# Patient Record
Sex: Female | Born: 1940 | Race: White | Hispanic: No | Marital: Married | Smoking: Never smoker
Health system: Southern US, Community
[De-identification: ages and names within clinical notes are randomized; demographics above are authoritative.]

## PROBLEM LIST (undated history)

## (undated) DIAGNOSIS — Z860100 Personal history of colon polyps, unspecified: Secondary | ICD-10-CM

## (undated) DIAGNOSIS — K219 Gastro-esophageal reflux disease without esophagitis: Secondary | ICD-10-CM

## (undated) DIAGNOSIS — E039 Hypothyroidism, unspecified: Secondary | ICD-10-CM

## (undated) DIAGNOSIS — M5030 Other cervical disc degeneration, unspecified cervical region: Secondary | ICD-10-CM

## (undated) DIAGNOSIS — Z8679 Personal history of other diseases of the circulatory system: Secondary | ICD-10-CM

## (undated) DIAGNOSIS — E7800 Pure hypercholesterolemia, unspecified: Secondary | ICD-10-CM

## (undated) HISTORY — DX: Personal history of colon polyps, unspecified: Z86.0100

## (undated) HISTORY — PX: BREAST EXCISIONAL BIOPSY: SUR124

## (undated) HISTORY — DX: Gastro-esophageal reflux disease without esophagitis: K21.9

## (undated) HISTORY — DX: Pure hypercholesterolemia, unspecified: E78.00

## (undated) HISTORY — PX: TRIGGER FINGER RELEASE: SHX641

## (undated) HISTORY — PX: ROTATOR CUFF REPAIR: SHX139

## (undated) HISTORY — PX: BREAST BIOPSY: SHX20

## (undated) HISTORY — DX: Other cervical disc degeneration, unspecified cervical region: M50.30

## (undated) HISTORY — DX: Personal history of other diseases of the circulatory system: Z86.79

## (undated) HISTORY — DX: Personal history of colonic polyps: Z86.010

## (undated) HISTORY — DX: Hypothyroidism, unspecified: E03.9

## (undated) HISTORY — PX: TONSILLECTOMY AND ADENOIDECTOMY: SUR1326

---

## 2000-04-29 HISTORY — PX: MASTECTOMY: SHX3

## 2007-04-30 HISTORY — PX: AUGMENTATION MAMMAPLASTY: SUR837

## 2013-04-29 HISTORY — PX: CATARACT EXTRACTION: SUR2

## 2019-06-09 ENCOUNTER — Telehealth: Payer: Self-pay | Admitting: General Practice

## 2019-06-09 NOTE — Telephone Encounter (Signed)
Telephone 02/23/2019 [facility]   Dale [place], MD Internal Medicine  Appointment Reason for call  Conversation: Appointment (Newest Message First) June 09, 2019 Me   BP  9:06 AM Note   Pt came into office 06/08/19 and wanted to set up new patient appt's with Dr. Lorin Picket. I made an appt for him on 11/02/19 and for his wife Malkia on 11/09/19. He didn't want a sooner appointment because they already had appts with the previous doctor before they moved here.       JB  9:02 AM Blenda Peals contacted Me (In Person)   February 23, 2019 Billie Lade   [facility]  2:35 PM Note   Lm  to call back to est. Care with Dr. Hinda Kehr, Margaretmary Dys   [facility]  2:35 PM Note   ----- Message from Larry Sierras, LPN sent at 37/34/2876  8:59 AM EDT ----- Regarding: FW: new pt Please schedule patient for new pt appt.  ----- Message ----- From: Dale [place], MD Sent: 02/15/2019   8:33 PM EDT To: Larry Sierras, LPN Subject: RE: new pt                                     Ok.   Dr Lorin Picket ----- Message ----- From: Larry Sierras, LPN Sent: 81/15/7262  11:30 AM EDT To: Dale [place], MD Subject: new pt                                         Please see phone note for this patient about him and his wife establishing care with you.

## 2019-08-17 ENCOUNTER — Encounter: Payer: Self-pay | Admitting: Internal Medicine

## 2019-11-05 ENCOUNTER — Ambulatory Visit (INDEPENDENT_AMBULATORY_CARE_PROVIDER_SITE_OTHER): Payer: Medicare Other | Admitting: Internal Medicine

## 2019-11-05 ENCOUNTER — Other Ambulatory Visit: Payer: Self-pay

## 2019-11-05 ENCOUNTER — Encounter: Payer: Self-pay | Admitting: Internal Medicine

## 2019-11-05 VITALS — BP 128/76 | HR 79 | Temp 98.1°F | Resp 16 | Ht 61.5 in | Wt 128.4 lb

## 2019-11-05 DIAGNOSIS — Z1231 Encounter for screening mammogram for malignant neoplasm of breast: Secondary | ICD-10-CM

## 2019-11-05 DIAGNOSIS — M5030 Other cervical disc degeneration, unspecified cervical region: Secondary | ICD-10-CM | POA: Diagnosis not present

## 2019-11-05 DIAGNOSIS — E039 Hypothyroidism, unspecified: Secondary | ICD-10-CM

## 2019-11-05 DIAGNOSIS — E7800 Pure hypercholesterolemia, unspecified: Secondary | ICD-10-CM | POA: Diagnosis not present

## 2019-11-05 DIAGNOSIS — Z853 Personal history of malignant neoplasm of breast: Secondary | ICD-10-CM

## 2019-11-05 DIAGNOSIS — H269 Unspecified cataract: Secondary | ICD-10-CM

## 2019-11-05 DIAGNOSIS — Z8589 Personal history of malignant neoplasm of other organs and systems: Secondary | ICD-10-CM

## 2019-11-05 DIAGNOSIS — Z86010 Personal history of colonic polyps: Secondary | ICD-10-CM

## 2019-11-05 NOTE — Progress Notes (Signed)
Patient ID: Kayla Navarro, female   DOB: 1940/12/13, 79 y.o.   MRN: [id number]   Subjective:    Patient ID: Kayla Navarro, female    DOB: 11-17-1940, 79 y.o.   MRN: [id number]  HPI This visit occurred during the SARS-CoV-2 public health emergency.  Safety protocols were in place, including screening questions prior to the visit, additional usage of staff PPE, and extensive cleaning of exam room while observing appropriate contact time as indicated for disinfecting solutions.  Patient here to establish care.  Previously being seen by a physician in [place].  Has a history of elevated blood pressure.  Off hctz now for a while.  States blood pressures mostly averaging around 045 systolic readings. Stays active.  Tai Chi Art therapist.  Walks regularly.  No chest pain or sob.  No acid reflux.  No abdominal pain or cramping.  Bowels moving.  Borderline cholesterol.  Taking red yeast rice.  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  Has a history of breast cancer.  S/p left breast mastectomy.  Is s/p reconstructive surgery - breast.  Also has neck pain.  Degenerative disc - cervical spine.  Some right arm numbness associated.  Her exercises and stretches - have helped and made this manageable.  States needs cataract surgery.  Needs f/u with ophthalmology here.     Past Medical History:  Diagnosis Date  . Degenerative disc disease, cervical   . GERD (gastroesophageal reflux disease)   . History of colon polyps   . History of hypertension   . Hypercholesteremia   . Hypothyroid    Past Surgical History:  Procedure Laterality Date  . CATARACT EXTRACTION Right 2015  . MASTECTOMY  2002  . ROTATOR CUFF REPAIR    . TONSILLECTOMY AND ADENOIDECTOMY    . TRIGGER FINGER RELEASE     Family History  Problem Relation Age of Onset  . Stomach cancer Mother   . Hypertension Mother   . CAD Mother   . Hypercholesterolemia Mother   . Parkinson's disease Father   . Heart disease Father    . High Cholesterol Father   . Heart disease Brother   . Parkinson's disease Paternal Grandmother    Social History   Socioeconomic History  . Marital status: Married    Spouse name: Not on file  . Number of children: Not on file  . Years of education: Not on file  . Highest education level: Not on file  Occupational History  . Not on file  Tobacco Use  . Smoking status: Never Smoker  . Smokeless tobacco: Never Used  Substance and Sexual Activity  . Alcohol use: Yes    Comment: occasional  . Drug use: Never  . Sexual activity: Not Currently  Other Topics Concern  . Not on file  Social History Narrative  . Not on file   Social Determinants of Health   Financial Resource Strain:   . Difficulty of Paying Living Expenses:   Food Insecurity:   . Worried About Charity fundraiser in the Last Year:   . Arboriculturist in the Last Year:   Transportation Needs:   . Film/video editor (Medical):   Marland Kitchen Lack of Transportation (Non-Medical):   Physical Activity:   . Days of Exercise per Week:   . Minutes of Exercise per Session:   Stress:   . Feeling of Stress :   Social Connections:   . Frequency of Communication with Friends and Family:   .  Frequency of Social Gatherings with Friends and Family:   . Attends Religious Services:   . Active Member of Clubs or Organizations:   . Attends Archivist Meetings:   Marland Kitchen Marital Status:     Outpatient Encounter Medications as of 11/05/2019  Medication Sig  . levothyroxine (SYNTHROID) 75 MCG tablet Take 75 mcg by mouth daily.  . Red Yeast Rice Extract 600 MG CAPS Take 600 mg by mouth daily.  Marland Kitchen zolpidem (AMBIEN) 10 MG tablet Take 10 mg by mouth at bedtime as needed.   No facility-administered encounter medications on file as of 11/05/2019.    Review of Systems  Constitutional: Negative for appetite change and unexpected weight change.  HENT: Negative for congestion and sinus pressure.   Respiratory: Negative for cough,  chest tightness and shortness of breath.   Cardiovascular: Negative for chest pain, palpitations and leg swelling.  Gastrointestinal: Negative for abdominal pain, diarrhea, nausea and vomiting.  Genitourinary: Negative for difficulty urinating and dysuria.  Musculoskeletal: Positive for neck pain. Negative for joint swelling and myalgias.  Skin: Negative for color change and rash.  Neurological: Negative for dizziness, light-headedness and headaches.  Psychiatric/Behavioral: Negative for agitation and dysphoric mood.       Objective:    Physical Exam Vitals reviewed.  Constitutional:      General: She is not in acute distress.    Appearance: Normal appearance.  HENT:     Head: Normocephalic and atraumatic.     Right Ear: External ear normal.     Left Ear: External ear normal.  Eyes:     General: No scleral icterus.       Right eye: No discharge.        Left eye: No discharge.     Conjunctiva/sclera: Conjunctivae normal.  Neck:     Thyroid: No thyromegaly.  Cardiovascular:     Rate and Rhythm: Normal rate and regular rhythm.  Pulmonary:     Effort: No respiratory distress.     Breath sounds: Normal breath sounds. No wheezing.  Abdominal:     General: Bowel sounds are normal.     Palpations: Abdomen is soft.     Tenderness: There is no abdominal tenderness.  Musculoskeletal:        General: No swelling or tenderness.     Cervical back: Neck supple. No tenderness.  Lymphadenopathy:     Cervical: No cervical adenopathy.  Skin:    Findings: No erythema or rash.  Neurological:     Mental Status: She is alert.  Psychiatric:        Mood and Affect: Mood normal.        Behavior: Behavior normal.     BP 128/76   Pulse 79   Temp 98.1 F (36.7 C)   Resp 16   Ht 5' 1.5" (1.562 m)   Wt 128 lb 6.4 oz (58.2 kg)   SpO2 98%   BMI 23.87 kg/m  Wt Readings from Last 3 Encounters:  11/05/19 128 lb 6.4 oz (58.2 kg)       Assessment & Plan:   Problem List Items Addressed  This Visit    Cataract    Request referral to ophthalmology.        Relevant Orders   Ambulatory referral to Ophthalmology   Degenerative disc disease, cervical    Exercises and stretches help.  Follow.  Stable.       History of breast cancer    S/p left breast mastectomy.  Due mammogram.  Schedule.        Relevant Orders   MM Digital Screening Unilat R   History of colon polyps    Up to date with colonoscopy.  Obtain results of last colonoscopy.        History of squamous cell carcinoma    Followed by dermatology.        Hypercholesteremia    Low cholesterol diet and exercise.  On red yeast rice.  Follow lipid panel.        Relevant Orders   Comprehensive metabolic panel   CBC with Differential/Platelet   Lipid panel   Hypothyroidism    On thyroid replacement.  Follow tsh.       Relevant Medications   levothyroxine (SYNTHROID) 75 MCG tablet   Other Relevant Orders   TSH    Other Visit Diagnoses    Encounter for screening mammogram for malignant neoplasm of breast    -  Primary   Relevant Orders   MM Digital Screening Unilat R       Einar Pheasant, MD

## 2019-11-06 ENCOUNTER — Telehealth: Payer: Self-pay | Admitting: Internal Medicine

## 2019-11-06 ENCOUNTER — Encounter: Payer: Self-pay | Admitting: Internal Medicine

## 2019-11-06 DIAGNOSIS — Z853 Personal history of malignant neoplasm of breast: Secondary | ICD-10-CM | POA: Insufficient documentation

## 2019-11-06 DIAGNOSIS — E7800 Pure hypercholesterolemia, unspecified: Secondary | ICD-10-CM | POA: Insufficient documentation

## 2019-11-06 DIAGNOSIS — M5030 Other cervical disc degeneration, unspecified cervical region: Secondary | ICD-10-CM | POA: Insufficient documentation

## 2019-11-06 DIAGNOSIS — H269 Unspecified cataract: Secondary | ICD-10-CM | POA: Insufficient documentation

## 2019-11-06 DIAGNOSIS — Z86010 Personal history of colonic polyps: Secondary | ICD-10-CM | POA: Insufficient documentation

## 2019-11-06 DIAGNOSIS — Z8589 Personal history of malignant neoplasm of other organs and systems: Secondary | ICD-10-CM | POA: Insufficient documentation

## 2019-11-06 DIAGNOSIS — E039 Hypothyroidism, unspecified: Secondary | ICD-10-CM | POA: Insufficient documentation

## 2019-11-06 NOTE — Assessment & Plan Note (Signed)
S/p left breast mastectomy.  Due mammogram.  Schedule.

## 2019-11-06 NOTE — Assessment & Plan Note (Signed)
On thyroid replacement.  Follow tsh.  

## 2019-11-06 NOTE — Assessment & Plan Note (Signed)
Request referral to ophthalmology.

## 2019-11-06 NOTE — Telephone Encounter (Signed)
Need copy of colonoscopy report.  Also, I have placed order for right breast screening mammogram.  Needs to be scheduled.

## 2019-11-06 NOTE — Assessment & Plan Note (Signed)
Up to date with colonoscopy.  Obtain results of last colonoscopy.

## 2019-11-06 NOTE — Assessment & Plan Note (Signed)
Followed by dermatology

## 2019-11-06 NOTE — Assessment & Plan Note (Signed)
Exercises and stretches help.  Follow.  Stable.

## 2019-11-06 NOTE — Assessment & Plan Note (Signed)
Low cholesterol diet and exercise.  On red yeast rice.  Follow lipid panel.

## 2019-11-09 ENCOUNTER — Ambulatory Visit: Payer: Self-pay | Admitting: Internal Medicine

## 2019-11-23 NOTE — Telephone Encounter (Signed)
Left detailed message for patient. Need to know where last colonoscopy was done so I can request.

## 2019-12-01 ENCOUNTER — Other Ambulatory Visit (INDEPENDENT_AMBULATORY_CARE_PROVIDER_SITE_OTHER): Payer: Medicare Other

## 2019-12-01 ENCOUNTER — Other Ambulatory Visit: Payer: Self-pay

## 2019-12-01 DIAGNOSIS — E039 Hypothyroidism, unspecified: Secondary | ICD-10-CM

## 2019-12-01 DIAGNOSIS — E7800 Pure hypercholesterolemia, unspecified: Secondary | ICD-10-CM

## 2019-12-01 LAB — LIPID PANEL
Cholesterol: 218 mg/dL — ABNORMAL HIGH (ref 0–200)
HDL: 79.3 mg/dL (ref >39.00)
LDL Cholesterol: 121 mg/dL — ABNORMAL HIGH (ref 0–99)
NonHDL: 138.55
Total CHOL/HDL Ratio: 3
Triglycerides: 86 mg/dL (ref 0.0–149.0)
VLDL: 17.2 mg/dL (ref 0.0–40.0)

## 2019-12-01 LAB — CBC WITH DIFFERENTIAL/PLATELET
Basophils Absolute: 0 10*3/uL (ref 0.0–0.1)
Basophils Relative: 0.5 % (ref 0.0–3.0)
Eosinophils Absolute: 0.1 10*3/uL (ref 0.0–0.7)
Eosinophils Relative: 2.1 % (ref 0.0–5.0)
HCT: 39.1 % (ref 36.0–46.0)
Hemoglobin: 13.2 g/dL (ref 12.0–15.0)
Lymphocytes Relative: 28.4 % (ref 12.0–46.0)
Lymphs Abs: 1.2 10*3/uL (ref 0.7–4.0)
MCHC: 33.8 g/dL (ref 30.0–36.0)
MCV: 90.5 fl (ref 78.0–100.0)
Monocytes Absolute: 0.3 10*3/uL (ref 0.1–1.0)
Monocytes Relative: 6.4 % (ref 3.0–12.0)
Neutro Abs: 2.6 10*3/uL (ref 1.4–7.7)
Neutrophils Relative %: 62.6 % (ref 43.0–77.0)
Platelets: 179 10*3/uL (ref 150.0–400.0)
RBC: 4.32 Mil/uL (ref 3.87–5.11)
RDW: 13.6 % (ref 11.5–15.5)
WBC: 4.1 10*3/uL (ref 4.0–10.5)

## 2019-12-01 LAB — COMPREHENSIVE METABOLIC PANEL
ALT: 17 U/L (ref 0–35)
AST: 23 U/L (ref 0–37)
Albumin: 4.4 g/dL (ref 3.5–5.2)
Alkaline Phosphatase: 49 U/L (ref 39–117)
BUN: 16 mg/dL (ref 6–23)
CO2: 30 mEq/L (ref 19–32)
Calcium: 9.6 mg/dL (ref 8.4–10.5)
Chloride: 104 mEq/L (ref 96–112)
Creatinine, Ser: 0.8 mg/dL (ref 0.40–1.20)
GFR: 69.1 mL/min (ref >60.00)
Glucose, Bld: 100 mg/dL — ABNORMAL HIGH (ref 70–99)
Potassium: 4.4 mEq/L (ref 3.5–5.1)
Sodium: 141 mEq/L (ref 135–145)
Total Bilirubin: 0.9 mg/dL (ref 0.2–1.2)
Total Protein: 6.7 g/dL (ref 6.0–8.3)

## 2019-12-01 LAB — TSH: TSH: 1.2 u[IU]/mL (ref 0.35–4.50)

## 2019-12-03 NOTE — Telephone Encounter (Signed)
LMTCB

## 2019-12-06 ENCOUNTER — Telehealth: Payer: Self-pay | Admitting: Internal Medicine

## 2019-12-06 NOTE — Telephone Encounter (Signed)
Pt was returning call from friday

## 2019-12-06 NOTE — Telephone Encounter (Signed)
See previous message

## 2019-12-09 NOTE — Telephone Encounter (Signed)
LM with husband for the wife to call back to ask where she had her las colonoscopy done.  Coralee North ,cma

## 2020-01-18 ENCOUNTER — Telehealth: Payer: Self-pay | Admitting: Internal Medicine

## 2020-01-18 NOTE — Telephone Encounter (Signed)
I lft pt a vm to inquire if pt has gone to any facility to have mammo done. If so where?

## 2020-02-11 ENCOUNTER — Other Ambulatory Visit: Payer: Self-pay | Admitting: Internal Medicine

## 2020-02-14 ENCOUNTER — Encounter: Payer: Self-pay | Admitting: Internal Medicine

## 2020-02-14 ENCOUNTER — Other Ambulatory Visit: Payer: Self-pay

## 2020-02-14 ENCOUNTER — Ambulatory Visit (INDEPENDENT_AMBULATORY_CARE_PROVIDER_SITE_OTHER): Payer: Medicare Other | Admitting: Internal Medicine

## 2020-02-14 DIAGNOSIS — E039 Hypothyroidism, unspecified: Secondary | ICD-10-CM | POA: Diagnosis not present

## 2020-02-14 DIAGNOSIS — R232 Flushing: Secondary | ICD-10-CM

## 2020-02-14 DIAGNOSIS — Z86010 Personal history of colonic polyps: Secondary | ICD-10-CM | POA: Diagnosis not present

## 2020-02-14 DIAGNOSIS — E7800 Pure hypercholesterolemia, unspecified: Secondary | ICD-10-CM | POA: Diagnosis not present

## 2020-02-14 DIAGNOSIS — Z853 Personal history of malignant neoplasm of breast: Secondary | ICD-10-CM

## 2020-02-14 DIAGNOSIS — M5030 Other cervical disc degeneration, unspecified cervical region: Secondary | ICD-10-CM

## 2020-02-14 NOTE — Progress Notes (Signed)
Patient ID: Kayla Navarro, female   DOB: 11-16-1940, 79 y.o.   MRN: [id number]   Subjective:    Patient ID: Kayla Navarro, female    DOB: Aug 12, 1940, 79 y.o.   MRN: [id number]  HPI This visit occurred during the SARS-CoV-2 public health emergency.  Safety protocols were in place, including screening questions prior to the visit, additional usage of staff PPE, and extensive cleaning of exam room while observing appropriate contact time as indicated for disinfecting solutions.  Patient here for a scheduled follow up. She reports she is doing relatively well.  Stays active.  Exercises.  Goes hiking.  No chest pain or increased sob with increased activity or exertion.  She has had issues with hot flashes. On questioning her , she reports this has been present for years.  Not as bad now.  Intermittent flares.  Triggered by alcohol and spices (some of the triggers).  May occur 2-3x/week.  Thought previously was related to femara.  Has been off this medication and will still have intermittent issues.  Has tried effexor, SSRI, gabepentin.  No headache.  No dizziness.  Is having issues with her neck and right arm - numbness.  Planning to see Dr Dutch Quint.  No acid reflux or abdominal pain reported.  Blood pressure averaging 138-140/78.  Has appt scheduled with Dr Inez Pilgrim.  Up to date with colonoscopy.   Past Medical History:  Diagnosis Date  . Degenerative disc disease, cervical   . GERD (gastroesophageal reflux disease)   . History of colon polyps   . History of hypertension   . Hypercholesteremia   . Hypothyroid    Past Surgical History:  Procedure Laterality Date  . CATARACT EXTRACTION Right 2015  . MASTECTOMY  2002  . ROTATOR CUFF REPAIR    . TONSILLECTOMY AND ADENOIDECTOMY    . TRIGGER FINGER RELEASE     Family History  Problem Relation Age of Onset  . Stomach cancer Mother   . Hypertension Mother   . CAD Mother   . Hypercholesterolemia Mother   . Parkinson's disease Father   . Heart  disease Father   . High Cholesterol Father   . Heart disease Brother   . Parkinson's disease Paternal Grandmother    Social History   Socioeconomic History  . Marital status: Married    Spouse name: Not on file  . Number of children: Not on file  . Years of education: Not on file  . Highest education level: Not on file  Occupational History  . Not on file  Tobacco Use  . Smoking status: Never Smoker  . Smokeless tobacco: Never Used  Substance and Sexual Activity  . Alcohol use: Yes    Comment: occasional  . Drug use: Never  . Sexual activity: Not Currently  Other Topics Concern  . Not on file  Social History Narrative  . Not on file   Social Determinants of Health   Financial Resource Strain:   . Difficulty of Paying Living Expenses: Not on file  Food Insecurity:   . Worried About Programme researcher, broadcasting/film/video in the Last Year: Not on file  . Ran Out of Food in the Last Year: Not on file  Transportation Needs:   . Lack of Transportation (Medical): Not on file  . Lack of Transportation (Non-Medical): Not on file  Physical Activity:   . Days of Exercise per Week: Not on file  . Minutes of Exercise per Session: Not on file  Stress:   . Feeling of  Stress : Not on file  Social Connections:   . Frequency of Communication with Friends and Family: Not on file  . Frequency of Social Gatherings with Friends and Family: Not on file  . Attends Religious Services: Not on file  . Active Member of Clubs or Organizations: Not on file  . Attends Banker Meetings: Not on file  . Marital Status: Not on file    Outpatient Encounter Medications as of 02/14/2020  Medication Sig  . levothyroxine (SYNTHROID) 75 MCG tablet Take 75 mcg by mouth daily.  . Red Yeast Rice Extract 600 MG CAPS Take 600 mg by mouth daily.  Marland Kitchen zolpidem (AMBIEN) 10 MG tablet Take 10 mg by mouth at bedtime as needed.   No facility-administered encounter medications on file as of 02/14/2020.    Review of  Systems  Constitutional: Negative for appetite change and unexpected weight change.  HENT: Negative for congestion and sinus pressure.   Respiratory: Negative for cough, chest tightness and shortness of breath.   Cardiovascular: Negative for chest pain, palpitations and leg swelling.  Gastrointestinal: Negative for abdominal pain, diarrhea, nausea and vomiting.  Genitourinary: Negative for difficulty urinating and dysuria.  Musculoskeletal: Negative for joint swelling and myalgias.  Skin: Negative for color change and rash.  Neurological: Negative for dizziness, light-headedness and headaches.  Psychiatric/Behavioral: Negative for agitation and dysphoric mood.       Objective:    Physical Exam Vitals reviewed.  Constitutional:      General: She is not in acute distress. HENT:     Head: Normocephalic and atraumatic.     Right Ear: External ear normal.     Left Ear: External ear normal.  Eyes:     General: No scleral icterus.       Right eye: No discharge.        Left eye: No discharge.     Conjunctiva/sclera: Conjunctivae normal.  Neck:     Thyroid: No thyromegaly.  Cardiovascular:     Rate and Rhythm: Normal rate and regular rhythm.  Pulmonary:     Effort: No respiratory distress.     Breath sounds: Normal breath sounds. No wheezing.  Abdominal:     General: Bowel sounds are normal.     Palpations: Abdomen is soft.     Tenderness: There is no abdominal tenderness.  Musculoskeletal:        General: No swelling or tenderness.     Cervical back: Neck supple. No tenderness.  Lymphadenopathy:     Cervical: No cervical adenopathy.  Skin:    Findings: No erythema or rash.  Neurological:     Mental Status: She is alert.  Psychiatric:        Mood and Affect: Mood normal.        Behavior: Behavior normal.     BP 134/70   Pulse 68   Temp 98.1 F (36.7 C) (Oral)   Resp 16   Ht 5\' 2"  (1.575 m)   Wt 125 lb 6.4 oz (56.9 kg)   SpO2 98%   BMI 22.94 kg/m  Wt Readings  from Last 3 Encounters:  02/14/20 125 lb 6.4 oz (56.9 kg)  11/05/19 128 lb 6.4 oz (58.2 kg)     Lab Results  Component Value Date   WBC 4.1 12/01/2019   HGB 13.2 12/01/2019   HCT 39.1 12/01/2019   PLT 179.0 12/01/2019   GLUCOSE 100 (H) 12/01/2019   CHOL 218 (H) 12/01/2019   TRIG 86.0 12/01/2019  HDL 79.30 12/01/2019   LDLCALC 121 (H) 12/01/2019   ALT 17 12/01/2019   AST 23 12/01/2019   NA 141 12/01/2019   K 4.4 12/01/2019   CL 104 12/01/2019   CREATININE 0.80 12/01/2019   BUN 16 12/01/2019   CO2 30 12/01/2019   TSH 1.20 12/01/2019       Assessment & Plan:   Problem List Items Addressed This Visit    Hypothyroidism    On thyroid replacement.  Follow tsh.       Hypercholesteremia    The 10-year ASCVD risk score Denman George DC Jr., et al., 2013) is: 27.4%   Values used to calculate the score:     Age: 48 years     Sex: Female     Is Non-Hispanic African American: No     Diabetic: No     Tobacco smoker: No     Systolic Blood Pressure: 134 mmHg     Is BP treated: No     HDL Cholesterol: 79.3 mg/dL     Total Cholesterol: 218 mg/dL  Discussed calculated cholesterol risk. Discussed recommendation for cholesterol medication.  She declines.  Will continue diet and exercise.  Follow lipid panel.       Relevant Orders   Lipid panel   Comprehensive metabolic panel   Hot flashes    Question of hot flashes.  Previously on femara.  Thought symptoms related to femara. Has been off femara.  Has continued to have variety of symptoms as outlined.  Avoid known triggers. Discussed treatment.  Discussed trial of another SSRI.  Discussed low dose gabapentin.  States mostly just needs something to help sleep.  Has tried melatonin.  Desires no prescription medication.  Discussed calm aid.  Follow.        History of colon polyps    States up to date.  Still need copy of colonoscopy.        History of breast cancer    S/p left breast mastectomy.  Due mammogram.  Need to schedule.         Degenerative disc disease, cervical    Increased neck pain and arm pain/numbness as outlined.  Planning to see Dr Dutch Quint.            Dale [place], MD

## 2020-02-14 NOTE — Assessment & Plan Note (Addendum)
The 10-year ASCVD risk score Denman George DC Montez Hageman., et al., 2013) is: 27.4%   Values used to calculate the score:     Age: 79 years     Sex: Female     Is Non-Hispanic African American: No     Diabetic: No     Tobacco smoker: No     Systolic Blood Pressure: 134 mmHg     Is BP treated: No     HDL Cholesterol: 79.3 mg/dL     Total Cholesterol: 218 mg/dL  Discussed calculated cholesterol risk. Discussed recommendation for cholesterol medication.  She declines.  Will continue diet and exercise.  Follow lipid panel.

## 2020-02-20 ENCOUNTER — Encounter: Payer: Self-pay | Admitting: Internal Medicine

## 2020-02-20 DIAGNOSIS — R232 Flushing: Secondary | ICD-10-CM | POA: Insufficient documentation

## 2020-02-20 NOTE — Assessment & Plan Note (Signed)
States up to date.  Still need copy of colonoscopy.

## 2020-02-20 NOTE — Assessment & Plan Note (Signed)
Question of hot flashes.  Previously on femara.  Thought symptoms related to femara. Has been off femara.  Has continued to have variety of symptoms as outlined.  Avoid known triggers. Discussed treatment.  Discussed trial of another SSRI.  Discussed low dose gabapentin.  States mostly just needs something to help sleep.  Has tried melatonin.  Desires no prescription medication.  Discussed calm aid.  Follow.

## 2020-02-20 NOTE — Assessment & Plan Note (Signed)
On thyroid replacement.  Follow tsh.  

## 2020-02-20 NOTE — Assessment & Plan Note (Signed)
S/p left breast mastectomy.  Due mammogram.  Need to schedule.

## 2020-02-20 NOTE — Assessment & Plan Note (Signed)
Increased neck pain and arm pain/numbness as outlined.  Planning to see Dr Dutch Quint.

## 2020-03-07 ENCOUNTER — Other Ambulatory Visit: Payer: Self-pay

## 2020-03-07 ENCOUNTER — Ambulatory Visit
Admission: RE | Admit: 2020-03-07 | Discharge: 2020-03-07 | Disposition: A | Discharge status: Home / Self Care | Payer: Medicare Other | Source: Ambulatory Visit | Attending: Internal Medicine | Admitting: Internal Medicine

## 2020-03-07 DIAGNOSIS — Z1231 Encounter for screening mammogram for malignant neoplasm of breast: Secondary | ICD-10-CM

## 2020-03-07 DIAGNOSIS — Z853 Personal history of malignant neoplasm of breast: Secondary | ICD-10-CM | POA: Insufficient documentation

## 2020-03-07 DIAGNOSIS — R928 Other abnormal and inconclusive findings on diagnostic imaging of breast: Secondary | ICD-10-CM | POA: Diagnosis not present

## 2020-03-09 ENCOUNTER — Other Ambulatory Visit: Payer: Self-pay | Admitting: Internal Medicine

## 2020-03-09 DIAGNOSIS — R928 Other abnormal and inconclusive findings on diagnostic imaging of breast: Secondary | ICD-10-CM

## 2020-03-29 ENCOUNTER — Other Ambulatory Visit: Payer: Self-pay | Admitting: Internal Medicine

## 2020-03-29 ENCOUNTER — Ambulatory Visit
Admission: RE | Admit: 2020-03-29 | Discharge: 2020-03-29 | Disposition: A | Discharge status: Home / Self Care | Payer: Medicare Other | Source: Ambulatory Visit | Attending: Internal Medicine | Admitting: Internal Medicine

## 2020-03-29 ENCOUNTER — Other Ambulatory Visit: Payer: Self-pay

## 2020-03-29 DIAGNOSIS — R928 Other abnormal and inconclusive findings on diagnostic imaging of breast: Secondary | ICD-10-CM

## 2020-03-29 NOTE — Progress Notes (Signed)
Order placed for GI referral.   

## 2020-05-31 ENCOUNTER — Other Ambulatory Visit: Payer: Self-pay

## 2020-05-31 ENCOUNTER — Other Ambulatory Visit: Payer: Medicare Other

## 2020-06-02 ENCOUNTER — Other Ambulatory Visit: Payer: Self-pay

## 2020-06-02 ENCOUNTER — Ambulatory Visit (INDEPENDENT_AMBULATORY_CARE_PROVIDER_SITE_OTHER): Payer: Medicare Other | Admitting: Internal Medicine

## 2020-06-02 ENCOUNTER — Other Ambulatory Visit (INDEPENDENT_AMBULATORY_CARE_PROVIDER_SITE_OTHER): Payer: Medicare Other

## 2020-06-02 DIAGNOSIS — Z853 Personal history of malignant neoplasm of breast: Secondary | ICD-10-CM

## 2020-06-02 DIAGNOSIS — Z8589 Personal history of malignant neoplasm of other organs and systems: Secondary | ICD-10-CM | POA: Diagnosis not present

## 2020-06-02 DIAGNOSIS — E039 Hypothyroidism, unspecified: Secondary | ICD-10-CM

## 2020-06-02 DIAGNOSIS — E7800 Pure hypercholesterolemia, unspecified: Secondary | ICD-10-CM | POA: Diagnosis not present

## 2020-06-02 DIAGNOSIS — M5030 Other cervical disc degeneration, unspecified cervical region: Secondary | ICD-10-CM

## 2020-06-02 LAB — COMPREHENSIVE METABOLIC PANEL
ALT: 18 U/L (ref 0–35)
AST: 24 U/L (ref 0–37)
Albumin: 4.5 g/dL (ref 3.5–5.2)
Alkaline Phosphatase: 54 U/L (ref 39–117)
BUN: 14 mg/dL (ref 6–23)
CO2: 29 mEq/L (ref 19–32)
Calcium: 9.7 mg/dL (ref 8.4–10.5)
Chloride: 101 mEq/L (ref 96–112)
Creatinine, Ser: 0.76 mg/dL (ref 0.40–1.20)
GFR: 74.22 mL/min (ref >60.00)
Glucose, Bld: 95 mg/dL (ref 70–99)
Potassium: 4.6 mEq/L (ref 3.5–5.1)
Sodium: 136 mEq/L (ref 135–145)
Total Bilirubin: 0.8 mg/dL (ref 0.2–1.2)
Total Protein: 6.9 g/dL (ref 6.0–8.3)

## 2020-06-02 LAB — LIPID PANEL
Cholesterol: 217 mg/dL — ABNORMAL HIGH (ref 0–200)
HDL: 87.1 mg/dL (ref >39.00)
LDL Cholesterol: 113 mg/dL — ABNORMAL HIGH (ref 0–99)
NonHDL: 130.39
Total CHOL/HDL Ratio: 2
Triglycerides: 85 mg/dL (ref 0.0–149.0)
VLDL: 17 mg/dL (ref 0.0–40.0)

## 2020-06-02 NOTE — Progress Notes (Signed)
Patient ID: Kayla Navarro, female   DOB: [birth year], 80 y.o.   MRN: [id number]   Subjective:    Patient ID: [name], female    DOB: 1940/11/20, 80 y.o.   MRN: [id number]  HPI This visit occurred during the SARS-CoV-2 public health emergency.  Safety protocols were in place, including screening questions prior to the visit, additional usage of staff PPE, and extensive cleaning of exam room while observing appropriate contact time as indicated for disinfecting solutions.  Patient here for scheduled physical.  Switched to follow up.  Dr Bary Castilla does her breast exams. Due f/u mammogram in 08/2020 - per pt.  Neck is doing better with Tai chi.  Stays active.  Hiking.  No chest pain or sob with increased activity or exertion.  No acid reflux.  No abdominal pain.  Bowels mvoing.  Sees Dr Kellie Moor - removal of squamous cell.  Discussed continued diet and exercise.  She has increased her red yeast rice - dosing.  Desires not to take prescription cholesterol medication.    Past Medical History:  Diagnosis Date  . Degenerative disc disease, cervical   . GERD (gastroesophageal reflux disease)   . History of colon polyps   . History of hypertension   . Hypercholesteremia   . Hypothyroid    Past Surgical History:  Procedure Laterality Date  . AUGMENTATION MAMMAPLASTY Left 2009   transflap done on mastectomy   . BREAST BIOPSY Right    1990's neg  . CATARACT EXTRACTION Right 2015  . MASTECTOMY  2002  . ROTATOR CUFF REPAIR    . TONSILLECTOMY AND ADENOIDECTOMY    . TRIGGER FINGER RELEASE     Family History  Problem Relation Age of Onset  . Stomach cancer Mother   . Hypertension Mother   . CAD Mother   . Hypercholesterolemia Mother   . Parkinson's disease Father   . Heart disease Father   . High Cholesterol Father   . Heart disease Brother   . Parkinson's disease Paternal Grandmother    Social History   Socioeconomic History  . Marital status: Married    Spouse name: Not on file  .  Number of children: Not on file  . Years of education: Not on file  . Highest education level: Not on file  Occupational History  . Not on file  Tobacco Use  . Smoking status: Never Smoker  . Smokeless tobacco: Never Used  Substance and Sexual Activity  . Alcohol use: Yes    Comment: occasional  . Drug use: Never  . Sexual activity: Not Currently  Other Topics Concern  . Not on file  Social History Narrative  . Not on file   Social Determinants of Health   Financial Resource Strain: Not on file  Food Insecurity: Not on file  Transportation Needs: Not on file  Physical Activity: Not on file  Stress: Not on file  Social Connections: Not on file    Outpatient Encounter Medications as of 06/02/2020  Medication Sig  . levothyroxine (SYNTHROID) 75 MCG tablet Take 1 tablet (75 mcg total) by mouth daily.  . Red Yeast Rice Extract 600 MG CAPS Take 600 mg by mouth daily.  Marland Kitchen zolpidem (AMBIEN) 10 MG tablet Take 10 mg by mouth at bedtime as needed.  . [DISCONTINUED] levothyroxine (SYNTHROID) 75 MCG tablet Take 75 mcg by mouth daily.   No facility-administered encounter medications on file as of 06/02/2020.    Review of Systems  Constitutional: Negative for appetite change and unexpected  weight change.  HENT: Negative for congestion and sinus pressure.   Respiratory: Negative for cough, chest tightness and shortness of breath.   Cardiovascular: Negative for chest pain, palpitations and leg swelling.  Gastrointestinal: Negative for abdominal pain, diarrhea, nausea and vomiting.  Genitourinary: Negative for difficulty urinating and dysuria.  Musculoskeletal: Negative for joint swelling and myalgias.  Skin: Negative for color change and rash.  Neurological: Negative for dizziness, light-headedness and headaches.  Psychiatric/Behavioral: Negative for agitation and dysphoric mood.       Objective:    Physical Exam Vitals reviewed.  Constitutional:      General: She is not in acute  distress.    Appearance: Normal appearance.  HENT:     Head: Normocephalic and atraumatic.     Right Ear: External ear normal.     Left Ear: External ear normal.     Mouth/Throat:     Mouth: Oropharynx is clear and moist.  Eyes:     General: No scleral icterus.       Right eye: No discharge.        Left eye: No discharge.     Conjunctiva/sclera: Conjunctivae normal.  Neck:     Thyroid: No thyromegaly.  Cardiovascular:     Rate and Rhythm: Normal rate and regular rhythm.  Pulmonary:     Effort: No respiratory distress.     Breath sounds: Normal breath sounds. No wheezing.  Abdominal:     General: Bowel sounds are normal.     Palpations: Abdomen is soft.     Tenderness: There is no abdominal tenderness.  Musculoskeletal:        General: No swelling, tenderness or edema.     Cervical back: Neck supple. No tenderness.  Lymphadenopathy:     Cervical: No cervical adenopathy.  Skin:    Findings: No erythema or rash.  Neurological:     Mental Status: She is alert.  Psychiatric:        Mood and Affect: Mood normal.        Behavior: Behavior normal.     BP 122/60   Pulse 72   Temp 97.8 F (36.6 C) (Oral)   Resp 16   Ht '5\' 2"'  (1.575 m)   Wt 127 lb (57.6 kg)   SpO2 99%   BMI 23.23 kg/m  Wt Readings from Last 3 Encounters:  06/02/20 127 lb (57.6 kg)  02/14/20 125 lb 6.4 oz (56.9 kg)  11/05/19 128 lb 6.4 oz (58.2 kg)     Lab Results  Component Value Date   WBC 4.1 12/01/2019   HGB 13.2 12/01/2019   HCT 39.1 12/01/2019   PLT 179.0 12/01/2019   GLUCOSE 95 06/02/2020   CHOL 217 (H) 06/02/2020   TRIG 85.0 06/02/2020   HDL 87.10 06/02/2020   LDLCALC 113 (H) 06/02/2020   ALT 18 06/02/2020   AST 24 06/02/2020   NA 136 06/02/2020   K 4.6 06/02/2020   CL 101 06/02/2020   CREATININE 0.76 06/02/2020   BUN 14 06/02/2020   CO2 29 06/02/2020   TSH 1.20 12/01/2019    MM DIAG BREAST TOMO UNI RIGHT  Addendum Date: 05/19/2020   ADDENDUM REPORT: 05/19/2020 07:57  ADDENDUM: Comparison made with prior films dated 04/18/2015, 05/22/2016, 06/12/2017 09/09/2018 from [place], [place] and prior imaging dated 09/09/2018 and 03/07/2020 from [facility]. Stereotactic biopsy of the distortion in the right breast is recommended. Electronically Signed   By: Lillia Mountain M.D.   On: 05/19/2020 07:57  Result Date: 05/19/2020 CLINICAL DATA:  Patient was recalled from screening mammogram for possible distortion in the right breast. Patient is status post left mastectomy. EXAM: DIGITAL DIAGNOSTIC UNILATERAL RIGHT MAMMOGRAM WITH TOMO AND CAD COMPARISON:  Previous exam(s). ACR Breast Density Category c: The breast tissue is heterogeneously dense, which may obscure small masses. FINDINGS: Additional imaging of the right breast was performed. There is persistent subtle distortion in the upper-outer quadrant of the right breast best seen on the lateral views. There are no malignant type microcalcifications. Mammographic images were processed with CAD. IMPRESSION: Persistent subtle distortion in the upper-outer quadrant of the right breast seen on the lateral views. RECOMMENDATION: Stereotactic biopsy of the distortion in the upper-outer quadrant of the right breast is recommended. I have discussed the findings and recommendations with the patient. If applicable, a reminder letter will be sent to the patient regarding the next appointment. BI-RADS CATEGORY  4: Suspicious. Electronically Signed: By: Lillia Mountain M.D. On: 03/29/2020 10:52       Assessment & Plan:   Problem List Items Addressed This Visit    Degenerative disc disease, cervical    Neck is doing better.  Follow.  Continue Tai Chi.       History of breast cancer    S/p left breast mastectomy.  Saw Dr Bary Castilla 04/04/20 - recommended f/u in 6 months.        History of squamous cell carcinoma    Followed by dermatology.        Hypercholesteremia    Low cholesterol diet and exercise.  Follow  lipid panel.  Check lipid panel today.       Hypothyroidism    On thyroid replacement.  Follow tsh.       Relevant Medications   levothyroxine (SYNTHROID) 75 MCG tablet       Einar Pheasant, MD

## 2020-06-03 ENCOUNTER — Encounter: Payer: Self-pay | Admitting: Internal Medicine

## 2020-06-03 MED ORDER — LEVOTHYROXINE SODIUM 75 MCG PO TABS
75.0000 ug | ORAL_TABLET | Freq: Every day | ORAL | 3 refills | Status: DC
Start: 2020-06-03 — End: 2020-08-01

## 2020-06-03 NOTE — Assessment & Plan Note (Signed)
Low cholesterol diet and exercise.  Follow lipid panel  Check lipid panel today.  

## 2020-06-03 NOTE — Assessment & Plan Note (Signed)
Followed by dermatology

## 2020-06-03 NOTE — Assessment & Plan Note (Signed)
Neck is doing better.  Follow.  Continue Tai Chi.

## 2020-06-03 NOTE — Assessment & Plan Note (Signed)
S/p left breast mastectomy.  Saw Dr Lemar Livings 04/04/20 - recommended f/u in 6 months.

## 2020-06-03 NOTE — Assessment & Plan Note (Signed)
On thyroid replacement.  Follow tsh.  

## 2020-06-28 ENCOUNTER — Telehealth: Payer: Self-pay | Admitting: Internal Medicine

## 2020-06-28 NOTE — Telephone Encounter (Signed)
Results printed and mailed.   

## 2020-06-28 NOTE — Telephone Encounter (Signed)
Pt is requesting physical labs from February be mailed to her so she can have a hard copy.   Please advise

## 2020-06-29 ENCOUNTER — Ambulatory Visit (INDEPENDENT_AMBULATORY_CARE_PROVIDER_SITE_OTHER): Payer: Medicare Other

## 2020-06-29 ENCOUNTER — Other Ambulatory Visit: Payer: Self-pay | Admitting: General Surgery

## 2020-06-29 VITALS — Ht 62.0 in | Wt 127.0 lb

## 2020-06-29 DIAGNOSIS — Z853 Personal history of malignant neoplasm of breast: Secondary | ICD-10-CM

## 2020-06-29 DIAGNOSIS — Z0000 Encounter for general adult medical examination without abnormal findings: Secondary | ICD-10-CM

## 2020-06-29 NOTE — Progress Notes (Signed)
Subjective:   Kayla Navarro is a 80 y.o. female who presents for Medicare Annual (Subsequent) preventive examination.  Review of Systems     Cardiac Risk Factors include: advanced age (>44mn, >>62women)No ROS.  Medicare Wellness Virtual Visit.        Objective:    Today's Vitals   06/29/20 1417  Weight: 127 lb (57.6 kg)  Height: 5' 2" (1.575 m)   Body mass index is 23.23 kg/m.  Advanced Directives 06/29/2020  Does Patient Have a Medical Advance Directive? Yes  Type of [place]  Does patient want to make changes to medical advance directive? No - Patient declined  Copy of [place] Chart? No - copy requested    Current Medications (verified) Outpatient Encounter Medications as of 06/29/2020  Medication Sig  . levothyroxine (SYNTHROID) 75 MCG tablet Take 1 tablet (75 mcg total) by mouth daily.  . Red Yeast Rice Extract 600 MG CAPS Take 600 mg by mouth daily.  .Marland Kitchenzolpidem (AMBIEN) 10 MG tablet Take 10 mg by mouth at bedtime as needed.   No facility-administered encounter medications on file as of 06/29/2020.    Allergies (verified) Clindamycin, Sulfa antibiotics, Codeine, and Tramadol   History: Past Medical History:  Diagnosis Date  . Degenerative disc disease, cervical   . GERD (gastroesophageal reflux disease)   . History of colon polyps   . History of hypertension   . Hypercholesteremia   . Hypothyroid    Past Surgical History:  Procedure Laterality Date  . AUGMENTATION MAMMAPLASTY Left 2009   transflap done on mastectomy   . BREAST BIOPSY Right    1990's neg  . CATARACT EXTRACTION Right 2015  . MASTECTOMY  2002  . ROTATOR CUFF REPAIR    . TONSILLECTOMY AND ADENOIDECTOMY    . TRIGGER FINGER RELEASE     Family History  Problem Relation Age of Onset  . Stomach cancer Mother   . Hypertension Mother   . CAD Mother   . Hypercholesterolemia Mother   . Parkinson's disease Father   .  Heart disease Father   . High Cholesterol Father   . Heart disease Brother   . Parkinson's disease Paternal Grandmother    Social History   Socioeconomic History  . Marital status: Married    Spouse name: Not on file  . Number of children: Not on file  . Years of education: Not on file  . Highest education level: Not on file  Occupational History  . Not on file  Tobacco Use  . Smoking status: Never Smoker  . Smokeless tobacco: Never Used  Substance and Sexual Activity  . Alcohol use: Yes    Comment: occasional  . Drug use: Never  . Sexual activity: Not Currently  Other Topics Concern  . Not on file  Social History Narrative  . Not on file   Social Determinants of Health   Financial Resource Strain: Low Risk   . Difficulty of Paying Living Expenses: Not hard at all  Food Insecurity: No Food Insecurity  . Worried About RCharity fundraiserin the Last Year: Never true  . Ran Out of Food in the Last Year: Never true  Transportation Needs: No Transportation Needs  . Lack of Transportation (Medical): No  . Lack of Transportation (Non-Medical): No  Physical Activity: Not on file  Stress: No Stress Concern Present  . Feeling of Stress : Not at all  Social Connections: Unknown  .  Frequency of Communication with Friends and Family: More than three times a week  . Frequency of Social Gatherings with Friends and Family: More than three times a week  . Attends Religious Services: Not on file  . Active Member of Clubs or Organizations: Not on file  . Attends Archivist Meetings: More than 4 times per year  . Marital Status: Married    Tobacco Counseling Counseling given: Not Answered   Clinical Intake:  Pre-visit preparation completed: Yes        Diabetes: No  How often do you need to have someone help you when you read instructions, pamphlets, or other written materials from your doctor or pharmacy?: 1 - Never   Interpreter Needed?: No       Activities of Daily Living In your present state of health, do you have any difficulty performing the following activities: 06/29/2020  Hearing? N  Vision? N  Difficulty concentrating or making decisions? N  Walking or climbing stairs? N  Dressing or bathing? N  Doing errands, shopping? N  Preparing Food and eating ? N  Using the Toilet? N  In the past six months, have you accidently leaked urine? N  Do you have problems with loss of bowel control? N  Managing your Medications? N  Managing your Finances? N  Housekeeping or managing your Housekeeping? N  Some recent data might be hidden    Patient Care Team: Einar Pheasant, MD as PCP - General (Internal Medicine)  Indicate any recent Medical Services you may have received from other than [facility] providers in the past year (date may be approximate).     Assessment:   This is a routine wellness examination for Kayla Navarro.  I connected with Kayla Navarro today by telephone and verified that I am speaking with the correct person using two identifiers. Location patient: home Location provider: work Persons participating in the virtual visit: patient, Marine scientist.    I discussed the limitations, risks, security and privacy concerns of performing an evaluation and management service by telephone and the availability of in person appointments. The patient expressed understanding and verbally consented to this telephonic visit.    Interactive audio and video telecommunications were attempted between this provider and patient, however failed, due to patient having technical difficulties OR patient did not have access to video capability.  We continued and completed visit with audio only.  Some vital signs may be absent or patient reported.   Hearing/Vision screen  Hearing Screening   125Hz 250Hz 500Hz 1000Hz 2000Hz 3000Hz 4000Hz 6000Hz 8000Hz  Right ear:           Left ear:           Comments: Patient is able to hear conversational tones without  difficulty.  No issues reported.  Vision Screening Comments: Visual acuity not assessed, virtual visit.  They have seen their ophthalmologist in the last 12 months.     Dietary issues and exercise activities discussed: Current Exercise Habits: Home exercise routine, Type of exercise: walking (Tai-Chi), Intensity: Mild  Healthy diet Good water intake  Goals    . Follow up with Primary Care Provider     As needed      Depression Screen PHQ 2/9 Scores 06/29/2020 02/14/2020  PHQ - 2 Score 0 0    Fall Risk Fall Risk  06/29/2020 11/05/2019  Falls in the past year? 0 0  Number falls in past yr: 0 -  Injury with Fall? 0 -  Follow up Falls evaluation  completed Falls evaluation completed    FALL RISK PREVENTION PERTAINING TO THE HOME: Handrails in use when climbing stairs? Yes Home free of loose throw rugs in walkways, pet beds, electrical cords, etc? Yes  Adequate lighting in your home to reduce risk of falls? Yes   ASSISTIVE DEVICES UTILIZED TO PREVENT FALLS: Use of a cane, walker or w/c? No   TIMED UP AND GO: Was the test performed? No . Virtual visit.   Cognitive Function:  Patient is alert and oriented x3.  Denies difficulty focusing, making decisions, memory loss.  Enjoys reading, sodoku and other brain health activities.  MMSE/6CIT deferred. Normal by direct communication/observation.      Immunizations Immunization History  Administered Date(s) Administered  . Hep A / Hep B 01/06/2008  . Influenza, High Dose Seasonal PF 04/05/2015, 02/01/2016, 02/17/2017, 03/31/2018  . Influenza-Unspecified 01/28/2019  . Moderna Sars-Covid-2 Vaccination 05/10/2018, 06/08/2019, 05/05/2020  . Pneumococcal Conjugate-13 05/26/2018  . Pneumococcal Polysaccharide-23 02/10/2012  . Tdap 01/06/2008  . Zoster 01/05/2009    Health Maintenance Health Maintenance  Topic Date Due  . TETANUS/TDAP  01/05/2018  . INFLUENZA VACCINE  07/27/2020 (Originally 11/28/2019)  . DEXA SCAN  06/29/2021  (Originally 05/27/2005)  . COVID-19 Vaccine (4 - Booster for Moderna series) 11/02/2020  . PNA vac Low Risk Adult  Completed  . HPV VACCINES  Aged Out   Colonoscopy- need a copy  Mammogram status: Completed 03/2020. Repeat every year DIGITAL DIAGNOSTIC UNILATERAL RIGHT MAMMOGRAM WITH TOMO AND CAD. 6 month follow up 08/2020.   Bone density- deferred per patient preference  Lung Cancer Screening: (Low Dose CT Chest recommended if Age 28-80 years, 30 pack-year currently smoking OR have quit w/in 15years.) does not qualify.   Vision Screening: Recommended annual ophthalmology exams for early detection of glaucoma and other disorders of the eye. Is the patient up to date with their annual eye exam?  Yes  Who is the provider or what is the name of the office in which the patient attends annual eye exams? [facility], Dr. Wallace Going.   Dermatology- Visits every 4 months. Dr. Darrick Huntsman.   Dental Screening: Recommended annual dental exams for proper oral hygiene. Visits every 6 months.   Community Resource Referral / Chronic Care Management: CRR required this visit?  No   CCM required this visit?  No      Plan:   Keep all routine maintenance appointments.   Follow up 12/01/20  Cpe 06/04/21   I have personally reviewed and noted the following in the patient's chart:   . Medical and social history . Use of alcohol, tobacco or illicit drugs  . Current medications and supplements- no use of opioids use . Functional ability and status . Nutritional status . Physical activity . Advanced directives . List of other physicians . Hospitalizations, surgeries, and ER visits in previous 12 months . Vitals . Screenings to include cognitive, depression, and falls . Referrals and appointments  In addition, I have reviewed and discussed with patient certain preventive protocols, quality metrics, and best practice recommendations. A written personalized care plan for preventive services as  well as general preventive health recommendations were provided to patient via mychart.     Varney Biles, LPN   09/28/9507

## 2020-06-29 NOTE — Patient Instructions (Addendum)
[name] , Thank you for taking time to come for your Medicare Wellness Visit. I appreciate your ongoing commitment to your health goals. Please review the following plan we discussed and let me know if I can assist you in the future.   These are the goals we discussed: Goals    . Follow up with Primary Care Provider     As needed       This is a list of the screening recommended for you and due dates:  Health Maintenance  Topic Date Due  . Tetanus Vaccine  01/05/2018  . Flu Shot  07/27/2020*  . DEXA scan (bone density measurement)  06/29/2021*  . COVID-19 Vaccine (4 - Booster for Moderna series) 11/02/2020  . Pneumonia vaccines  Completed  . HPV Vaccine  Aged Out  *Topic was postponed. The date shown is not the original due date.    Immunizations Immunization History  Administered Date(s) Administered  . Influenza, High Dose Seasonal PF 04/05/2015, 02/01/2016, 02/17/2017, 03/31/2018  . Influenza-Unspecified 01/28/2019  . Moderna Sars-Covid-2 Vaccination 05/10/2018, 06/08/2019  . Pneumococcal Conjugate-13 05/26/2018  . Pneumococcal Polysaccharide-23 02/10/2012  . Zoster 01/05/2009   Keep all routine maintenance appointments.   Follow up 12/01/20  Cpe 06/04/21   Advanced directives: End of life planning; Advance aging; Advanced directives discussed.  Copy of current HCPOA/Living Will requested.    Conditions/risks identified: none new  Follow up in one year for your annual wellness visit    Preventive Care 65 Years and Older, Female Preventive care refers to lifestyle choices and visits with your health care provider that can promote health and wellness. What does preventive care include?  A yearly physical exam. This is also called an annual well check.  Dental exams once or twice a year.  Routine eye exams. Ask your health care provider how often you should have your eyes checked.  Personal lifestyle choices, including:  Daily care of your teeth and  gums.  Regular physical activity.  Eating a healthy diet.  Avoiding tobacco and drug use.  Limiting alcohol use.  Practicing safe sex.  Taking low-dose aspirin every day.  Taking vitamin and mineral supplements as recommended by your health care provider. What happens during an annual well check? The services and screenings done by your health care provider during your annual well check will depend on your age, overall health, lifestyle risk factors, and family history of disease. Counseling  Your health care provider may ask you questions about your:  Alcohol use.  Tobacco use.  Drug use.  Emotional well-being.  Home and relationship well-being.  Sexual activity.  Eating habits.  History of falls.  Memory and ability to understand (cognition).  Work and work Astronomer.  Reproductive health. Screening  You may have the following tests or measurements:  Height, weight, and BMI.  Blood pressure.  Lipid and cholesterol levels. These may be checked every 5 years, or more frequently if you are over 27 years old.  Skin check.  Lung cancer screening. You may have this screening every year starting at age 77 if you have a 30-pack-year history of smoking and currently smoke or have quit within the past 15 years.  Fecal occult blood test (FOBT) of the stool. You may have this test every year starting at age 81.  Flexible sigmoidoscopy or colonoscopy. You may have a sigmoidoscopy every 5 years or a colonoscopy every 10 years starting at age 78.  Hepatitis C blood test.  Hepatitis B blood test.  Sexually transmitted disease (STD) testing.  Diabetes screening. This is done by checking your blood sugar (glucose) after you have not eaten for a while (fasting). You may have this done every 1-3 years.  Bone density scan. This is done to screen for osteoporosis. You may have this done starting at age 35.  Mammogram. This may be done every 1-2 years. Talk to your  health care provider about how often you should have regular mammograms. Talk with your health care provider about your test results, treatment options, and if necessary, the need for more tests. Vaccines  Your health care provider may recommend certain vaccines, such as:  Influenza vaccine. This is recommended every year.  Tetanus, diphtheria, and acellular pertussis (Tdap, Td) vaccine. You may need a Td booster every 10 years.  Zoster vaccine. You may need this after age 27.  Pneumococcal 13-valent conjugate (PCV13) vaccine. One dose is recommended after age 71.  Pneumococcal polysaccharide (PPSV23) vaccine. One dose is recommended after age 31. Talk to your health care provider about which screenings and vaccines you need and how often you need them. This information is not intended to replace advice given to you by your health care provider. Make sure you discuss any questions you have with your health care provider. Document Released: 05/12/2015 Document Revised: 01/03/2016 Document Reviewed: 02/14/2015 Elsevier Interactive Patient Education  2017 [employer] Prevention in the Home Falls can cause injuries. They can happen to people of all ages. There are many things you can do to make your home safe and to help prevent falls. What can I do on the outside of my home?  Regularly fix the edges of walkways and driveways and fix any cracks.  Remove anything that might make you trip as you walk through a door, such as a raised step or threshold.  Trim any bushes or trees on the path to your home.  Use bright outdoor lighting.  Clear any walking paths of anything that might make someone trip, such as rocks or tools.  Regularly check to see if handrails are loose or broken. Make sure that both sides of any steps have handrails.  Any raised decks and porches should have guardrails on the edges.  Have any leaves, snow, or ice cleared regularly.  Use sand or salt on walking  paths during winter.  Clean up any spills in your garage right away. This includes oil or grease spills. What can I do in the bathroom?  Use night lights.  Install grab bars by the toilet and in the tub and shower. Do not use towel bars as grab bars.  Use non-skid mats or decals in the tub or shower.  If you need to sit down in the shower, use a plastic, non-slip stool.  Keep the floor dry. Clean up any water that spills on the floor as soon as it happens.  Remove soap buildup in the tub or shower regularly.  Attach bath mats securely with double-sided non-slip rug tape.  Do not have throw rugs and other things on the floor that can make you trip. What can I do in the bedroom?  Use night lights.  Make sure that you have a light by your bed that is easy to reach.  Do not use any sheets or blankets that are too big for your bed. They should not hang down onto the floor.  Have a firm chair that has side arms. You can use this for support while you get  dressed.  Do not have throw rugs and other things on the floor that can make you trip. What can I do in the kitchen?  Clean up any spills right away.  Avoid walking on wet floors.  Keep items that you use a lot in easy-to-reach places.  If you need to reach something above you, use a strong step stool that has a grab bar.  Keep electrical cords out of the way.  Do not use floor polish or wax that makes floors slippery. If you must use wax, use non-skid floor wax.  Do not have throw rugs and other things on the floor that can make you trip. What can I do with my stairs?  Do not leave any items on the stairs.  Make sure that there are handrails on both sides of the stairs and use them. Fix handrails that are broken or loose. Make sure that handrails are as long as the stairways.  Check any carpeting to make sure that it is firmly attached to the stairs. Fix any carpet that is loose or worn.  Avoid having throw rugs at  the top or bottom of the stairs. If you do have throw rugs, attach them to the floor with carpet tape.  Make sure that you have a light switch at the top of the stairs and the bottom of the stairs. If you do not have them, ask someone to add them for you. What else can I do to help prevent falls?  Wear shoes that:  Do not have high heels.  Have rubber bottoms.  Are comfortable and fit you well.  Are closed at the toe. Do not wear sandals.  If you use a stepladder:  Make sure that it is fully opened. Do not climb a closed stepladder.  Make sure that both sides of the stepladder are locked into place.  Ask someone to hold it for you, if possible.  Clearly mark and make sure that you can see:  Any grab bars or handrails.  First and last steps.  Where the edge of each step is.  Use tools that help you move around (mobility aids) if they are needed. These include:  Canes.  Walkers.  Scooters.  Crutches.  Turn on the lights when you go into a dark area. Replace any light bulbs as soon as they burn out.  Set up your furniture so you have a clear path. Avoid moving your furniture around.  If any of your floors are uneven, fix them.  If there are any pets around you, be aware of where they are.  Review your medicines with your doctor. Some medicines can make you feel dizzy. This can increase your chance of falling. Ask your doctor what other things that you can do to help prevent falls. This information is not intended to replace advice given to you by your health care provider. Make sure you discuss any questions you have with your health care provider. Document Released: 02/09/2009 Document Revised: 09/21/2015 Document Reviewed: 05/20/2014 Elsevier Interactive Patient Education  2017 [employer].

## 2020-08-01 ENCOUNTER — Telehealth: Payer: Self-pay | Admitting: Internal Medicine

## 2020-08-01 MED ORDER — LEVOTHYROXINE SODIUM 75 MCG PO TABS
75.0000 ug | ORAL_TABLET | Freq: Every day | ORAL | 3 refills | Status: DC
Start: 2020-08-01 — End: 2021-08-20

## 2020-08-01 NOTE — Telephone Encounter (Signed)
[facility] called for refill for levothyroxine (SYNTHROID) 75 MCG tablet for patient

## 2020-08-18 ENCOUNTER — Telehealth (INDEPENDENT_AMBULATORY_CARE_PROVIDER_SITE_OTHER): Payer: Medicare Other | Admitting: Internal Medicine

## 2020-08-18 DIAGNOSIS — Z853 Personal history of malignant neoplasm of breast: Secondary | ICD-10-CM | POA: Diagnosis not present

## 2020-08-18 DIAGNOSIS — J329 Chronic sinusitis, unspecified: Secondary | ICD-10-CM

## 2020-08-18 MED ORDER — AMOXICILLIN 875 MG PO TABS: 875.0000 mg | ORAL_TABLET | Freq: Two times a day (BID) | ORAL | 0 refills | Status: AC

## 2020-08-18 NOTE — Progress Notes (Signed)
Patient ID: Kayla Navarro, female   DOB: Jan 11, 1941, 80 y.o.   MRN: [id number] .   Virtual Visit via video Note  This visit type was conducted due to national recommendations for restrictions regarding the COVID-19 pandemic (e.g. social distancing).  This format is felt to be most appropriate for this patient at this time.  All issues noted in this document were discussed and addressed.  No physical exam was performed (except for noted visual exam findings with Video Visits).   I connected with [name] by a video enabled telemedicine application and verified that I am speaking with the correct person using two identifiers. Location patient: home Location provider:  home office Persons participating in the virtual visit: patient, provider  The limitations, risks, security and privacy concerns of performing an evaluation and management service by video and the availability of in person appointments have been discussed.  It has also been discussed with the patient that there may be a patient responsible charge related to this service. The patient expressed understanding and agreed to proceed.   Reason for visit: work in appt  HPI: Work in with concerns regarding possible sinus infection.  States her family visited recently.  Grandson had a runny nose.  States her symptoms started 08/05/20 - reported a "sick throat".  Felt hot and cold.  No fever.  Had head congestion and pressure.  Hurt into her teeth.  Had feels full. Ears full.  Some drainage.  Coughing up some yellow mucus.  No sob.  No chest pain or chest tightness.  No coughing fits.  No body aches.  Using saline flushes.  Took reditab.  Eating and drinking ok.  No nausea or vomiting.  No diarrhea.    ROS: See pertinent positives and negatives per HPI.  Past Medical History:  Diagnosis Date  . Degenerative disc disease, cervical   . GERD (gastroesophageal reflux disease)   . History of colon polyps   . History of hypertension   .  Hypercholesteremia   . Hypothyroid     Past Surgical History:  Procedure Laterality Date  . AUGMENTATION MAMMAPLASTY Left 2009   transflap done on mastectomy   . BREAST BIOPSY Right    1990's neg  . CATARACT EXTRACTION Right 2015  . MASTECTOMY  2002  . ROTATOR CUFF REPAIR    . TONSILLECTOMY AND ADENOIDECTOMY    . TRIGGER FINGER RELEASE      Family History  Problem Relation Age of Onset  . Stomach cancer Mother   . Hypertension Mother   . CAD Mother   . Hypercholesterolemia Mother   . Parkinson's disease Father   . Heart disease Father   . High Cholesterol Father   . Heart disease Brother   . Parkinson's disease Paternal Grandmother     SOCIAL HX: reviewed.    Current Outpatient Medications:  .  amoxicillin (AMOXIL) 875 MG tablet, Take 1 tablet (875 mg total) by mouth 2 (two) times daily for 10 days., Disp: 20 tablet, Rfl: 0 .  levothyroxine (SYNTHROID) 75 MCG tablet, Take 1 tablet (75 mcg total) by mouth daily., Disp: 90 tablet, Rfl: 3 .  Red Yeast Rice Extract 600 MG CAPS, Take 600 mg by mouth daily., Disp: , Rfl:  .  zolpidem (AMBIEN) 10 MG tablet, Take 10 mg by mouth at bedtime as needed., Disp: , Rfl:   EXAM:  GENERAL: alert, oriented, appears well and in no acute distress  HEENT: atraumatic, conjunttiva clear, no obvious abnormalities on inspection of  external nose and ears  NECK: normal movements of the head and neck  LUNGS: on inspection no signs of respiratory distress, breathing rate appears normal, no obvious gross SOB, gasping or wheezing  CV: no obvious cyanosis  PSYCH/NEURO: pleasant and cooperative, no obvious depression or anxiety, speech and thought processing grossly intact  ASSESSMENT AND PLAN:  Discussed the following assessment and plan:  Problem List Items Addressed This Visit    History of breast cancer    Saw Dr Lemar Livings 04/04/20 - recommended f/u in 6 months.       Sinusitis    Increased sinus pressure and congestion as outlined.   Persistent symptoms.  Continue saline flushes as she is doing.  Continue robitussin.  Given persistence, treat with amoxicillin as directed.  Rest.  Stay hydrated.  Follow.  Call with update.  Instructed to take probiotics as directed.        Relevant Medications   amoxicillin (AMOXIL) 875 MG tablet       I discussed the assessment and treatment plan with the patient. The patient was provided an opportunity to ask questions and all were answered. The patient agreed with the plan and demonstrated an understanding of the instructions.   The patient was advised to call back or seek an in-person evaluation if the symptoms worsen or if the condition fails to improve as anticipated.    Dale Inglewood, MD

## 2020-08-19 ENCOUNTER — Encounter: Payer: Self-pay | Admitting: Internal Medicine

## 2020-08-19 DIAGNOSIS — J329 Chronic sinusitis, unspecified: Secondary | ICD-10-CM | POA: Insufficient documentation

## 2020-08-19 NOTE — Assessment & Plan Note (Signed)
Increased sinus pressure and congestion as outlined.  Persistent symptoms.  Continue saline flushes as she is doing.  Continue robitussin.  Given persistence, treat with amoxicillin as directed.  Rest.  Stay hydrated.  Follow.  Call with update.  Instructed to take probiotics as directed.

## 2020-08-19 NOTE — Assessment & Plan Note (Signed)
Saw Dr Lemar Livings 04/04/20 - recommended f/u in 6 months.

## 2020-09-26 ENCOUNTER — Ambulatory Visit
Admission: RE | Admit: 2020-09-26 | Discharge: 2020-09-26 | Disposition: A | Discharge status: Home / Self Care | Payer: Medicare Other | Source: Ambulatory Visit | Attending: General Surgery | Admitting: General Surgery

## 2020-09-26 ENCOUNTER — Other Ambulatory Visit: Payer: Self-pay

## 2020-09-26 DIAGNOSIS — Z853 Personal history of malignant neoplasm of breast: Secondary | ICD-10-CM | POA: Diagnosis not present

## 2020-10-03 ENCOUNTER — Other Ambulatory Visit: Payer: Self-pay | Admitting: General Surgery

## 2020-10-03 DIAGNOSIS — Z853 Personal history of malignant neoplasm of breast: Secondary | ICD-10-CM

## 2020-12-01 ENCOUNTER — Ambulatory Visit (INDEPENDENT_AMBULATORY_CARE_PROVIDER_SITE_OTHER): Payer: Medicare Other | Admitting: Internal Medicine

## 2020-12-01 ENCOUNTER — Encounter: Payer: Self-pay | Admitting: Internal Medicine

## 2020-12-01 ENCOUNTER — Other Ambulatory Visit: Payer: Self-pay

## 2020-12-01 VITALS — BP 124/68 | HR 87 | Temp 96.1°F | Ht 61.5 in | Wt 123.0 lb

## 2020-12-01 DIAGNOSIS — Z7184 Encounter for health counseling related to travel: Secondary | ICD-10-CM

## 2020-12-01 DIAGNOSIS — E039 Hypothyroidism, unspecified: Secondary | ICD-10-CM

## 2020-12-01 DIAGNOSIS — Z853 Personal history of malignant neoplasm of breast: Secondary | ICD-10-CM | POA: Diagnosis not present

## 2020-12-01 DIAGNOSIS — E7800 Pure hypercholesterolemia, unspecified: Secondary | ICD-10-CM

## 2020-12-01 DIAGNOSIS — R030 Elevated blood-pressure reading, without diagnosis of hypertension: Secondary | ICD-10-CM

## 2020-12-01 LAB — CBC WITH DIFFERENTIAL/PLATELET
Basophils Absolute: 0 10*3/uL (ref 0.0–0.1)
Basophils Relative: 0.3 % (ref 0.0–3.0)
Eosinophils Absolute: 0.1 10*3/uL (ref 0.0–0.7)
Eosinophils Relative: 1.8 % (ref 0.0–5.0)
HCT: 41.4 % (ref 36.0–46.0)
Hemoglobin: 13.8 g/dL (ref 12.0–15.0)
Lymphocytes Relative: 32.7 % (ref 12.0–46.0)
Lymphs Abs: 1.6 10*3/uL (ref 0.7–4.0)
MCHC: 33.4 g/dL (ref 30.0–36.0)
MCV: 90.9 fl (ref 78.0–100.0)
Monocytes Absolute: 0.3 10*3/uL (ref 0.1–1.0)
Monocytes Relative: 6.4 % (ref 3.0–12.0)
Neutro Abs: 2.8 10*3/uL (ref 1.4–7.7)
Neutrophils Relative %: 58.8 % (ref 43.0–77.0)
Platelets: 178 10*3/uL (ref 150.0–400.0)
RBC: 4.55 Mil/uL (ref 3.87–5.11)
RDW: 13.6 % (ref 11.5–15.5)
WBC: 4.8 10*3/uL (ref 4.0–10.5)

## 2020-12-01 LAB — COMPREHENSIVE METABOLIC PANEL
ALT: 18 U/L (ref 0–35)
AST: 24 U/L (ref 0–37)
Albumin: 4.6 g/dL (ref 3.5–5.2)
Alkaline Phosphatase: 55 U/L (ref 39–117)
BUN: 18 mg/dL (ref 6–23)
CO2: 30 mEq/L (ref 19–32)
Calcium: 9.9 mg/dL (ref 8.4–10.5)
Chloride: 103 mEq/L (ref 96–112)
Creatinine, Ser: 0.78 mg/dL (ref 0.40–1.20)
GFR: 71.69 mL/min (ref >60.00)
Glucose, Bld: 93 mg/dL (ref 70–99)
Potassium: 4.8 mEq/L (ref 3.5–5.1)
Sodium: 141 mEq/L (ref 135–145)
Total Bilirubin: 0.9 mg/dL (ref 0.2–1.2)
Total Protein: 7 g/dL (ref 6.0–8.3)

## 2020-12-01 LAB — LIPID PANEL
Cholesterol: 210 mg/dL — ABNORMAL HIGH (ref 0–200)
HDL: 82.3 mg/dL (ref >39.00)
LDL Cholesterol: 110 mg/dL — ABNORMAL HIGH (ref 0–99)
NonHDL: 127.32
Total CHOL/HDL Ratio: 3
Triglycerides: 89 mg/dL (ref 0.0–149.0)
VLDL: 17.8 mg/dL (ref 0.0–40.0)

## 2020-12-01 LAB — TSH: TSH: 0.61 u[IU]/mL (ref 0.35–5.50)

## 2020-12-01 NOTE — Progress Notes (Signed)
Patient ID: Kayla Navarro, female   DOB: 05-06-1940, 80 y.o.   MRN: [id number]   Subjective:    Patient ID: Kayla Navarro, female    DOB: Jan 16, 1941, 80 y.o.   MRN: [id number]  HPI This visit occurred during the SARS-CoV-2 public health emergency.  Safety protocols were in place, including screening questions prior to the visit, additional usage of staff PPE, and extensive cleaning of exam room while observing appropriate contact time as indicated for disinfecting solutions.   Patient here for a scheduled follow up.  Here to follow up regarding her thyroid.  Also has a history of breast cancer.  Had recent abnormal mammogram.  Saw Dr Lemar Livings.  Recommended f/u diagnostic mammogram in 6 months.  She had reported to him some discomfort - left breast - present since her breast surgery.  [facility].  Stays active.  Hikes.  No chest pain or sob with exertion.  No acid reflux.  No abdominal pain.  Bowels moving.    Past Medical History:  Diagnosis Date   Degenerative disc disease, cervical    GERD (gastroesophageal reflux disease)    History of colon polyps    History of hypertension    Hypercholesteremia    Hypothyroid    Past Surgical History:  Procedure Laterality Date   AUGMENTATION MAMMAPLASTY Left 2009   transflap done on mastectomy    BREAST BIOPSY Right    1990's neg   CATARACT EXTRACTION Right 2015   MASTECTOMY  2002   ROTATOR CUFF REPAIR     TONSILLECTOMY AND ADENOIDECTOMY     TRIGGER FINGER RELEASE     Family History  Problem Relation Age of Onset   Stomach cancer Mother    Hypertension Mother    CAD Mother    Hypercholesterolemia Mother    Parkinson's disease Father    Heart disease Father    High Cholesterol Father    Heart disease Brother    Parkinson's disease Paternal Grandmother    Social History   Socioeconomic History   Marital status: Married    Spouse name: Not on file   Number of children: Not on file   Years of  education: Not on file   Highest education level: Not on file  Occupational History   Not on file  Tobacco Use   Smoking status: Never   Smokeless tobacco: Never  Substance and Sexual Activity   Alcohol use: Yes    Comment: occasional   Drug use: Never   Sexual activity: Not Currently  Other Topics Concern   Not on file  Social History Narrative   Not on file   Social Determinants of Health   Financial Resource Strain: Low Risk    Difficulty of Paying Living Expenses: Not hard at all  Food Insecurity: No Food Insecurity   Worried About Programme researcher, broadcasting/film/video in the Last Year: Never true   Ran Out of Food in the Last Year: Never true  Transportation Needs: No Transportation Needs   Lack of Transportation (Medical): No   Lack of Transportation (Non-Medical): No  Physical Activity: Not on file  Stress: No Stress Concern Present   Feeling of Stress : Not at all  Social Connections: Unknown   Frequency of Communication with Friends and Family: More than three times a week   Frequency of Social Gatherings with Friends and Family: More than three times a week   Attends Religious Services: Not on file   Active Member of  Clubs or Organizations: Not on file   Attends Club or Organization Meetings: More than 4 times per year   Marital Status: Married    Review of Systems  Constitutional:  Negative for appetite change and unexpected weight change.  HENT:  Negative for congestion and sinus pressure.   Respiratory:  Negative for cough, chest tightness and shortness of breath.   Cardiovascular:  Negative for chest pain, palpitations and leg swelling.  Gastrointestinal:  Negative for abdominal pain, diarrhea, nausea and vomiting.  Genitourinary:  Negative for difficulty urinating and dysuria.  Musculoskeletal:  Negative for joint swelling and myalgias.  Skin:  Negative for color change and rash.  Neurological:  Negative for dizziness, light-headedness and headaches.   Psychiatric/Behavioral:  Negative for agitation and dysphoric mood.       Objective:    Physical Exam Vitals reviewed.  Constitutional:      General: She is not in acute distress.    Appearance: Normal appearance.  HENT:     Head: Normocephalic and atraumatic.     Right Ear: External ear normal.     Left Ear: External ear normal.  Eyes:     General: No scleral icterus.       Right eye: No discharge.        Left eye: No discharge.     Conjunctiva/sclera: Conjunctivae normal.  Neck:     Thyroid: No thyromegaly.  Cardiovascular:     Rate and Rhythm: Normal rate and regular rhythm.  Pulmonary:     Effort: No respiratory distress.     Breath sounds: Normal breath sounds. No wheezing.  Abdominal:     General: Bowel sounds are normal.     Palpations: Abdomen is soft.     Tenderness: There is no abdominal tenderness.  Musculoskeletal:        General: No swelling or tenderness.     Cervical back: Neck supple. No tenderness.  Lymphadenopathy:     Cervical: No cervical adenopathy.  Skin:    Findings: No erythema or rash.  Neurological:     Mental Status: She is alert.  Psychiatric:        Mood and Affect: Mood normal.        Behavior: Behavior normal.    BP 124/68   Pulse 87   Temp (!) 96.1 F (35.6 C)   Ht 5' 1.5" (1.562 m)   Wt 123 lb (55.8 kg)   SpO2 96%   BMI 22.87 kg/m  Wt Readings from Last 3 Encounters:  12/01/20 123 lb (55.8 kg)  08/18/20 126 lb (57.2 kg)  06/29/20 127 lb (57.6 kg)    Outpatient Encounter Medications as of 12/01/2020  Medication Sig   levothyroxine (SYNTHROID) 75 MCG tablet Take 1 tablet (75 mcg total) by mouth daily.   Red Yeast Rice Extract 600 MG CAPS Take 600 mg by mouth daily.   zolpidem (AMBIEN) 10 MG tablet Take 10 mg by mouth at bedtime as needed.   No facility-administered encounter medications on file as of 12/01/2020.     Lab Results  Component Value Date   WBC 4.8 12/01/2020   HGB 13.8 12/01/2020   HCT 41.4 12/01/2020    PLT 178.0 12/01/2020   GLUCOSE 93 12/01/2020   CHOL 210 (H) 12/01/2020   TRIG 89.0 12/01/2020   HDL 82.30 12/01/2020   LDLCALC 110 (H) 12/01/2020   ALT 18 12/01/2020   AST 24 12/01/2020   NA 141 12/01/2020   K 4.8 12/01/2020   CL  103 12/01/2020   CREATININE 0.78 12/01/2020   BUN 18 12/01/2020   CO2 30 12/01/2020   TSH 0.61 12/01/2020    MM DIAG BREAST TOMO UNI RIGHT  Result Date: 09/26/2020 CLINICAL DATA:  80 year old female presenting for high follow-up of distortion in the right breast. A stereotactic biopsy was recommended at the time of her diagnostic mammogram on 03/29/2020. She had an appointment with Dr. Lemar Livings, and states that they decided to follow-up the distortion with imaging rather than proceed with the biopsy. The patient has history of left breast mastectomy. EXAM: DIGITAL DIAGNOSTIC UNILATERAL RIGHT MAMMOGRAM WITH TOMOSYNTHESIS AND CAD TECHNIQUE: Right digital diagnostic mammography and breast tomosynthesis was performed. The images were evaluated with computer-aided detection. COMPARISON:  Previous exam(s). ACR Breast Density Category c: The breast tissue is heterogeneously dense, which may obscure small masses. FINDINGS: There is a persistent subtle distortion in the superior anterior right breast seen on the MLO view. No significant interval change as compared to the prior exam. No new suspicious calcifications, masses or areas of distortion are seen in the right breast. IMPRESSION: Stable appearance of the subtle distortion in the superior anterior right breast. RECOMMENDATION: Stereotactic biopsy is recommended. If follow-up imaging is pursued instead, six-month follow-up mammogram of the right breast is suggested. I have discussed the findings and recommendations with the patient. If applicable, a reminder letter will be sent to the patient regarding the next appointment. BI-RADS CATEGORY  4: Suspicious. Electronically Signed   By: Frederico Hamman M.D.   On: 09/26/2020  11:05       Assessment & Plan:   Problem List Items Addressed This Visit     Counseling for travel    Planning to travel to [place].  Had questions about covid and covid prevention.  Had questions about vitamin recommendations.  Information given.         Elevated blood pressure reading    Blood pressure increased - recheck.  Have her spot check her pressure.  Follow.        History of breast cancer    Evaluated by Dr Lemar Livings 10/03/20.  Recommended f/u bilateral diagnostic mammogram in 6 months.  Plans to see Dr Westley Foots - f/u regarding breast changes and discomfort s/p surgery.         Hypercholesteremia - Primary    Low cholesterol diet and exercise.  Follow lipid panel.  Check lipid panel today.        Relevant Orders   CBC with Differential/Platelet (Completed)   Comprehensive metabolic panel (Completed)   Lipid panel (Completed)   TSH (Completed)   Hypothyroidism    On thyroid replacement.  Follow tsh.          Dale Evergreen, MD

## 2020-12-01 NOTE — Patient Instructions (Signed)
Vit C 1000 mg daily     Vitamin D3 4000 IU daily     Zinc 50 mg daily     Quercetin 250 mg -500 mg twice daily ; this an antioxidant which reduces inflammation. You can find it at places such as [employer] and [employer] howeverit is not limited to these stores. (This is recommendation when diagnosed with covid).   [employer] - Blood pressure cuff/machine

## 2020-12-02 ENCOUNTER — Encounter: Payer: Self-pay | Admitting: Internal Medicine

## 2020-12-02 DIAGNOSIS — Z7184 Encounter for health counseling related to travel: Secondary | ICD-10-CM | POA: Insufficient documentation

## 2020-12-02 DIAGNOSIS — R030 Elevated blood-pressure reading, without diagnosis of hypertension: Secondary | ICD-10-CM | POA: Insufficient documentation

## 2020-12-02 NOTE — Assessment & Plan Note (Signed)
Planning to travel to [place].  Had questions about covid and covid prevention.  Had questions about vitamin recommendations.  Information given.

## 2020-12-02 NOTE — Assessment & Plan Note (Signed)
Low cholesterol diet and exercise.  Follow lipid panel  Check lipid panel today.  

## 2020-12-02 NOTE — Assessment & Plan Note (Signed)
Blood pressure increased - recheck.  Have her spot check her pressure.  Follow.

## 2020-12-02 NOTE — Assessment & Plan Note (Signed)
On thyroid replacement.  Follow tsh.  

## 2020-12-02 NOTE — Assessment & Plan Note (Signed)
Evaluated by Dr Lemar Livings 10/03/20.  Recommended f/u bilateral diagnostic mammogram in 6 months.  Plans to see Dr Westley Foots - f/u regarding breast changes and discomfort s/p surgery.

## 2021-01-05 ENCOUNTER — Institutional Professional Consult (permissible substitution): Payer: Medicare Other | Admitting: Plastic Surgery

## 2021-01-26 ENCOUNTER — Other Ambulatory Visit: Payer: Self-pay

## 2021-01-26 ENCOUNTER — Ambulatory Visit (INDEPENDENT_AMBULATORY_CARE_PROVIDER_SITE_OTHER): Payer: Medicare Other | Admitting: Plastic Surgery

## 2021-01-26 ENCOUNTER — Encounter: Payer: Self-pay | Admitting: Plastic Surgery

## 2021-01-26 VITALS — BP 121/76 | HR 73 | Ht 61.0 in | Wt 124.6 lb

## 2021-01-26 DIAGNOSIS — N651 Disproportion of reconstructed breast: Secondary | ICD-10-CM | POA: Diagnosis not present

## 2021-01-26 DIAGNOSIS — Z853 Personal history of malignant neoplasm of breast: Secondary | ICD-10-CM | POA: Diagnosis not present

## 2021-01-26 NOTE — Progress Notes (Signed)
Patient ID: Kayla Navarro, female    DOB: [birth year], 80 y.o.   MRN: [id number]   Chief Complaint  Patient presents with   consult   Breast Problem    The patient is an 80 year old female here for evaluation of her breasts.  She had breast cancer in 2002 and underwent a right sided mastectomy.  She then waited until 2009 and had reconstruction in [place] by a surgeon she calls Dr. Val Eagle.  She was evaluated at [facility] but then decided to go to [facility] instead.  She had a left DIEP flap done with and nipple reconstruction.  She now does not like the way that it looks and it was very uncomfortable and feels bulky especially under her arms.  She is 5 feet 1 inch tall and weighs 124 pounds.  She was sent by Dr. Lemar Livings.  The skin is all intact and she has an Hospital doctor.  The left sides a little bit bigger than the right side but she does have good shape.   Review of Systems  Constitutional: Negative.   HENT: Negative.    Eyes: Negative.   Respiratory: Negative.    Cardiovascular: Negative.   Gastrointestinal: Negative.   Endocrine: Negative.   Genitourinary: Negative.   Musculoskeletal: Negative.   Skin: Negative.    Past Medical History:  Diagnosis Date   Degenerative disc disease, cervical    GERD (gastroesophageal reflux disease)    History of colon polyps    History of hypertension    Hypercholesteremia    Hypothyroid     Past Surgical History:  Procedure Laterality Date   AUGMENTATION MAMMAPLASTY Left 2009   transflap done on mastectomy    BREAST BIOPSY Right    1990's neg   CATARACT EXTRACTION Right 2015   MASTECTOMY  2002   ROTATOR CUFF REPAIR     TONSILLECTOMY AND ADENOIDECTOMY     TRIGGER FINGER RELEASE        Current Outpatient Medications:    levothyroxine (SYNTHROID) 75 MCG tablet, Take 1 tablet (75 mcg total) by mouth daily., Disp: 90 tablet, Rfl: 3   Red Yeast Rice Extract 600 MG CAPS, Take 600 mg by mouth daily., Disp: , Rfl:    zolpidem  (AMBIEN) 10 MG tablet, Take 10 mg by mouth at bedtime as needed., Disp: , Rfl:    Calcium Carbonate Antacid (CALCIUM CARBONATE, DOSED IN MG ELEMENTAL CALCIUM,) 1250 MG/5ML SUSP, Take by mouth., Disp: , Rfl:    neomycin-polymyxin b-dexamethasone (MAXITROL) 3.5-10000-0.1 OINT, , Disp: , Rfl:    Objective:   Vitals:   01/26/21 1250  BP: 121/76  Pulse: 73  SpO2: 98%    Physical Exam Vitals and nursing note reviewed.  Constitutional:      Appearance: Normal appearance.  HENT:     Head: Normocephalic and atraumatic.  Cardiovascular:     Rate and Rhythm: Normal rate.     Pulses: Normal pulses.  Pulmonary:     Effort: Pulmonary effort is normal. No respiratory distress.     Breath sounds: No wheezing.  Abdominal:     General: Abdomen is flat. There is no distension.  Skin:    General: Skin is warm.     Coloration: Skin is not jaundiced.     Findings: No bruising.  Neurological:     General: No focal deficit present.     Mental Status: She is alert and oriented to person, place, and time.  Psychiatric:  Mood and Affect: Mood normal.        Behavior: Behavior normal.        Thought Content: Thought content normal.    Assessment & Plan:  History of breast cancer  Breast asymmetry following reconstructive surgery  We talked about options including doing nothing to the left breast and doing implants in her bra to the right side.  The left side can be made smaller but we must move carefully because we do not want to disrupt the blood supply.  Liposuction is an option.  I like her to think about it and will talk more in the next 1 to 2 weeks.  Pictures were obtained of the patient and placed in the chart with the patient's or guardian's permission.   Alena Bills Stamatia Masri, DO

## 2021-02-09 ENCOUNTER — Telehealth (INDEPENDENT_AMBULATORY_CARE_PROVIDER_SITE_OTHER): Payer: Medicare Other | Admitting: Plastic Surgery

## 2021-02-09 ENCOUNTER — Encounter: Payer: Self-pay | Admitting: Plastic Surgery

## 2021-02-09 DIAGNOSIS — N651 Disproportion of reconstructed breast: Secondary | ICD-10-CM | POA: Diagnosis not present

## 2021-02-09 NOTE — Progress Notes (Signed)
   Subjective:    Patient ID: Kayla Navarro, female    DOB: [birth year], 80 y.o.   MRN: [id number]  The patient is an 80 years old female joining me by phone.  She had breast cancer in 2002 and was treated with a right mastectomy.  She had reconstruction in 2009 in [place].  She had a DIEP flap and NAC reconstruction.  She does not like the bulkiness on the side and complains of the fullness under her arms.  She is 5 feet 1 inch tall and weighs 124 pounds.  The left side is a little bigger than the right.  She understands that this changes with her weight as well.  She also reached out to the surgeon in [place] to find out more information about her surgery.  She has not heard back from them yet.  At this point she is thinking that if she has any additional surgery she might go back to [facility].   Review of Systems  Constitutional: Negative.   Eyes: Negative.   Respiratory: Negative.    Cardiovascular: Negative.   Gastrointestinal: Negative.   Endocrine: Negative.   Genitourinary: Negative.   Hematological: Negative.       Objective:   Physical Exam      Assessment & Plan:     ICD-10-CM   1. Breast asymmetry following reconstructive surgery  N65.1       The patient is thinking to go back to [place] if she has any additional surgery.  But at the moment she is thinking to wait.  I think that this is prudent and I completely understand.  We remain available if she needs anything.  She also got a prescription for [facility] [employer].  She said that she has not been there yet but she is planning on going.   I connected with  Kayla Navarro on 02/09/21 by phone per her request and verified that I am speaking with the correct person using two identifiers. The patient was at home and I was at the office. The total time spent was 10 min in the following way: Review of the chart and previous documentation, discussion with the patient, and documentation of today's visit.   I discussed  the limitations of evaluation and management by telemedicine. The patient expressed understanding and agreed to proceed.

## 2021-03-07 ENCOUNTER — Other Ambulatory Visit: Payer: Self-pay | Admitting: General Surgery

## 2021-03-07 DIAGNOSIS — Z853 Personal history of malignant neoplasm of breast: Secondary | ICD-10-CM

## 2021-03-30 ENCOUNTER — Ambulatory Visit
Admission: RE | Admit: 2021-03-30 | Discharge: 2021-03-30 | Disposition: A | Discharge status: Home / Self Care | Payer: Medicare Other | Source: Ambulatory Visit | Attending: General Surgery | Admitting: General Surgery

## 2021-03-30 ENCOUNTER — Other Ambulatory Visit: Payer: Self-pay

## 2021-03-30 DIAGNOSIS — Z853 Personal history of malignant neoplasm of breast: Secondary | ICD-10-CM

## 2021-04-05 ENCOUNTER — Other Ambulatory Visit: Payer: Self-pay

## 2021-04-05 ENCOUNTER — Ambulatory Visit (INDEPENDENT_AMBULATORY_CARE_PROVIDER_SITE_OTHER): Payer: Medicare Other

## 2021-04-05 DIAGNOSIS — Z23 Encounter for immunization: Secondary | ICD-10-CM | POA: Diagnosis not present

## 2021-05-25 ENCOUNTER — Encounter: Payer: Self-pay | Admitting: Nurse Practitioner

## 2021-05-25 ENCOUNTER — Ambulatory Visit (INDEPENDENT_AMBULATORY_CARE_PROVIDER_SITE_OTHER): Payer: Medicare Other | Admitting: Nurse Practitioner

## 2021-05-25 DIAGNOSIS — Z8616 Personal history of COVID-19: Secondary | ICD-10-CM | POA: Insufficient documentation

## 2021-05-25 DIAGNOSIS — U071 COVID-19: Secondary | ICD-10-CM | POA: Diagnosis not present

## 2021-05-25 MED ORDER — MOLNUPIRAVIR EUA 200MG CAPSULE: 4.0000 | ORAL_CAPSULE | Freq: Two times a day (BID) | ORAL | 0 refills | Status: AC

## 2021-05-25 NOTE — Progress Notes (Signed)
Patient ID: Kayla Navarro, female    DOB: 1940-05-19, 81 y.o.   MRN: [id number]  Virtual visit completed through [employer], a video enabled telemedicine application. Due to national recommendations of social distancing due to COVID-19, a virtual visit is felt to be most appropriate for this patient at this time. Reviewed limitations, risks, security and privacy concerns of performing a virtual visit and the availability of in person appointments. I also reviewed that there may be a patient responsible charge related to this service. The patient agreed to proceed.   Patient location: home Provider location: [place] at [facility], office Persons participating in this virtual visit: patient, provider   If any vitals were documented, they were collected by patient at home unless specified below.    There were no vitals taken for this visit.   CC: Covid 19 Subjective:   HPI: Kayla Navarro is a 81 y.o. female presenting on 05/25/2021 for Covid Positive (On 05/25/21, sx started 05/24/21-hoarseness, cough, runny nose, headache. )  Symptoms started on 05/24/2021 Test it was positive Moderna x 2 and one booster. States that her husband was positive.  Dr. Lorin Picket got her some plant base immune booster.    Relevant past medical, surgical, family and social history reviewed and updated as indicated. Interim medical history since our last visit reviewed. Allergies and medications reviewed and updated. Outpatient Medications Prior to Visit  Medication Sig Dispense Refill   Biotin 10 MG TABS Take by mouth.     Calcium Carbonate Antacid (CALCIUM CARBONATE, DOSED IN MG ELEMENTAL CALCIUM,) 1250 MG/5ML SUSP Take by mouth.     Cholecalciferol (VITAMIN D3) 10 MCG (400 UNIT) CAPS Take by mouth.     levothyroxine (SYNTHROID) 75 MCG tablet Take 1 tablet (75 mcg total) by mouth daily. 90 tablet 3   Red Yeast Rice Extract 600 MG CAPS Take 600 mg by mouth daily.     Zinc Sulfate (ZINC 15 PO) Take by mouth.      zolpidem (AMBIEN) 10 MG tablet Take 10 mg by mouth at bedtime as needed.     neomycin-polymyxin b-dexamethasone (MAXITROL) 3.5-10000-0.1 OINT      No facility-administered medications prior to visit.     Per HPI unless specifically indicated in ROS section below Review of Systems  Constitutional:  Positive for fatigue and fever (subjective). Negative for chills.  HENT:  Positive for congestion, rhinorrhea and sinus pressure. Negative for ear discharge, ear pain and sore throat.   Respiratory:  Positive for cough (deep and dry). Negative for shortness of breath.   Cardiovascular:  Negative for chest pain.  Gastrointestinal:  Negative for abdominal pain, diarrhea, nausea and vomiting.  Musculoskeletal:  Negative for arthralgias and myalgias.  Neurological:  Positive for dizziness and light-headedness. Negative for headaches (full feeling).  Objective:  There were no vitals taken for this visit.  Wt Readings from Last 3 Encounters:  01/26/21 124 lb 9.6 oz (56.5 kg)  12/01/20 123 lb (55.8 kg)  08/18/20 126 lb (57.2 kg)       Physical exam: Gen: alert, NAD, not ill appearing Pulm: speaks in complete sentences without increased work of breathing Psych: normal mood, normal thought content      Results for orders placed or performed in visit on 12/01/20  CBC with Differential/Platelet  Result Value Ref Range   WBC 4.8 4.0 - 10.5 K/uL   RBC 4.55 3.87 - 5.11 Mil/uL   Hemoglobin 13.8 12.0 - 15.0 g/dL   HCT 50.0 93.8 - 18.2 %  MCV 90.9 78.0 - 100.0 fl   MCHC 33.4 30.0 - 36.0 g/dL   RDW 78.213.6 95.611.5 - 21.315.5 %   Platelets 178.0 150.0 - 400.0 K/uL   Neutrophils Relative % 58.8 43.0 - 77.0 %   Lymphocytes Relative 32.7 12.0 - 46.0 %   Monocytes Relative 6.4 3.0 - 12.0 %   Eosinophils Relative 1.8 0.0 - 5.0 %   Basophils Relative 0.3 0.0 - 3.0 %   Neutro Abs 2.8 1.4 - 7.7 K/uL   Lymphs Abs 1.6 0.7 - 4.0 K/uL   Monocytes Absolute 0.3 0.1 - 1.0 K/uL   Eosinophils Absolute 0.1 0.0 - 0.7  K/uL   Basophils Absolute 0.0 0.0 - 0.1 K/uL  Comprehensive metabolic panel  Result Value Ref Range   Sodium 141 135 - 145 mEq/L   Potassium 4.8 3.5 - 5.1 mEq/L   Chloride 103 96 - 112 mEq/L   CO2 30 19 - 32 mEq/L   Glucose, Bld 93 70 - 99 mg/dL   BUN 18 6 - 23 mg/dL   Creatinine, Ser 0.860.78 0.40 - 1.20 mg/dL   Total Bilirubin 0.9 0.2 - 1.2 mg/dL   Alkaline Phosphatase 55 39 - 117 U/L   AST 24 0 - 37 U/L   ALT 18 0 - 35 U/L   Total Protein 7.0 6.0 - 8.3 g/dL   Albumin 4.6 3.5 - 5.2 g/dL   GFR 57.8471.69 >69.62>60.00 mL/min   Calcium 9.9 8.4 - 10.5 mg/dL  Lipid panel  Result Value Ref Range   Cholesterol 210 (H) 0 - 200 mg/dL   Triglycerides 95.289.0 0.0 - 149.0 mg/dL   HDL 84.1382.30 >24.40>39.00 mg/dL   VLDL 10.217.8 0.0 - 72.540.0 mg/dL   LDL Cholesterol 366110 (H) 0 - 99 mg/dL   Total CHOL/HDL Ratio 3    NonHDL 127.32   TSH  Result Value Ref Range   TSH 0.61 0.35 - 5.50 uIU/mL   Assessment & Plan:   Problem List Items Addressed This Visit       Other   COVID-19 - Primary    Patient positive for COVID-19.  Did discuss antivirals and that they are EUA only.  After joint discussion patient elected to pursue antiviral treatment.  Did discuss common side effects of medications selected.  Also discussed in detail CDC guidelines and recommendations in regards to quarantine.  Discussed with patient signs and symptoms when to seek urgent or emergent health care. Start molnupiravir as soon as possible      Relevant Medications   molnupiravir EUA (LAGEVRIO) 200 mg CAPS capsule     Meds ordered this encounter  Medications   molnupiravir EUA (LAGEVRIO) 200 mg CAPS capsule    Sig: Take 4 capsules (800 mg total) by mouth 2 (two) times daily for 5 days.    Dispense:  40 capsule    Refill:  0    Order Specific Question:   Supervising Provider    Answer:   TOWER, MARNE A [[id number]]   No orders of the defined types were placed in this encounter.   I discussed the assessment and treatment plan with the patient. The  patient was provided an opportunity to ask questions and all were answered. The patient agreed with the plan and demonstrated an understanding of the instructions. The patient was advised to call back or seek an in-person evaluation if the symptoms worsen or if the condition fails to improve as anticipated.  Follow up plan: No follow-ups on file.  Audria NineMatt Meghin Thivierge,  NP

## 2021-05-25 NOTE — Assessment & Plan Note (Signed)
Patient positive for COVID-19.  Did discuss antivirals and that they are EUA only.  After joint discussion patient elected to pursue antiviral treatment.  Did discuss common side effects of medications selected.  Also discussed in detail CDC guidelines and recommendations in regards to quarantine.  Discussed with patient signs and symptoms when to seek urgent or emergent health care. Start molnupiravir as soon as possible

## 2021-05-29 ENCOUNTER — Telehealth: Payer: Self-pay | Admitting: Internal Medicine

## 2021-05-29 DIAGNOSIS — E7800 Pure hypercholesterolemia, unspecified: Secondary | ICD-10-CM

## 2021-05-29 NOTE — Telephone Encounter (Signed)
Pt called in wanting to know if she needs blood work done before her physical

## 2021-06-01 NOTE — Telephone Encounter (Signed)
Labs ordered. Patient is aware. Appt scheduled

## 2021-06-04 ENCOUNTER — Encounter: Payer: PRIVATE HEALTH INSURANCE | Admitting: Internal Medicine

## 2021-06-05 ENCOUNTER — Other Ambulatory Visit: Payer: Self-pay

## 2021-06-05 ENCOUNTER — Telehealth (INDEPENDENT_AMBULATORY_CARE_PROVIDER_SITE_OTHER): Payer: Medicare Other | Admitting: Family

## 2021-06-05 VITALS — Temp 97.9°F | Wt 126.0 lb

## 2021-06-05 DIAGNOSIS — J0110 Acute frontal sinusitis, unspecified: Secondary | ICD-10-CM | POA: Insufficient documentation

## 2021-06-05 MED ORDER — AMOXICILLIN 875 MG PO TABS: 875.0000 mg | ORAL_TABLET | Freq: Two times a day (BID) | ORAL | 0 refills | Status: AC

## 2021-06-05 MED ORDER — AMOXICILLIN-POT CLAVULANATE 875-125 MG PO TABS: 1.0000 | ORAL_TABLET | Freq: Two times a day (BID) | ORAL | 0 refills | Status: DC

## 2021-06-05 NOTE — Assessment & Plan Note (Signed)
Prescription given for augmentin 875/125 mg po bid for ten days. Pt to continue tylenol/ibuprofen prn sinus pain. Continue with humidifier prn and steam showers recommended as well. instructed If no symptom improvement in 48 hours please f/u ? ?

## 2021-06-05 NOTE — Progress Notes (Signed)
MyChart Video Visit    Virtual Visit via Video Note   This visit type was conducted due to national recommendations for restrictions regarding the COVID-19 Pandemic (e.g. social distancing) in an effort to limit this patient's exposure and mitigate transmission in our community. This patient is at least at moderate risk for complications without adequate follow up. This format is felt to be most appropriate for this patient at this time. Physical exam was limited by quality of the video and audio technology used for the visit. CMA was able to get the patient set up on a video visit.  Patient location: Home. Patient and provider in visit Provider location: Office  I discussed the limitations of evaluation and management by telemedicine and the availability of in person appointments. The patient expressed understanding and agreed to proceed.  Visit Date: 06/05/2021  Today's healthcare provider: Eugenia Pancoast, FNP     Subjective:    Patient ID: Kayla Navarro, female    DOB: 1940/12/24, 81 y.o.   MRN: [id number]  Chief Complaint  Patient presents with   Nasal Congestion    Clear discharge. Still testing positive for covid 12 days after. Started antiviral on 05/25/21   Laryngitis    HPI  Pt here with c/o via video visit.  Tested positive 05/25/21, was started on molnupiravir, completed after five days. Tested again after five days and tested negative for covid. She did continue with sinus pressure/nasal congestion and still going on. She is starting to lose her voice with some laryngitis. Decided to retest for covid and she was again positive.   She denies cough, sob and or chest congestion. No ear pain, no sore throat.   No fever and or chills.   Past Medical History:  Diagnosis Date   Degenerative disc disease, cervical    GERD (gastroesophageal reflux disease)    History of colon polyps    History of hypertension    Hypercholesteremia    Hypothyroid     Past Surgical  History:  Procedure Laterality Date   AUGMENTATION MAMMAPLASTY Left 2009   transflap done on mastectomy    BREAST BIOPSY Right    1990's neg   CATARACT EXTRACTION Right 2015   MASTECTOMY  2002   ROTATOR CUFF REPAIR     TONSILLECTOMY AND ADENOIDECTOMY     TRIGGER FINGER RELEASE      Family History  Problem Relation Age of Onset   Stomach cancer Mother    Hypertension Mother    CAD Mother    Hypercholesterolemia Mother    Parkinson's disease Father    Heart disease Father    High Cholesterol Father    Heart disease Brother    Parkinson's disease Paternal Grandmother     Social History   Socioeconomic History   Marital status: Married    Spouse name: Not on file   Number of children: Not on file   Years of education: Not on file   Highest education level: Not on file  Occupational History   Not on file  Tobacco Use   Smoking status: Never   Smokeless tobacco: Never  Substance and Sexual Activity   Alcohol use: Yes    Comment: occasional   Drug use: Never   Sexual activity: Not Currently  Other Topics Concern   Not on file  Social History Narrative   Not on file   Social Determinants of Health   Financial Resource Strain: Low Risk    Difficulty of Paying Living Expenses:  Not hard at all  Food Insecurity: No Food Insecurity   Worried About Programme researcher, broadcasting/film/videounning Out of Food in the Last Year: Never true   Ran Out of Food in the Last Year: Never true  Transportation Needs: No Transportation Needs   Lack of Transportation (Medical): No   Lack of Transportation (Non-Medical): No  Physical Activity: Not on file  Stress: No Stress Concern Present   Feeling of Stress : Not at all  Social Connections: Unknown   Frequency of Communication with Friends and Family: More than three times a week   Frequency of Social Gatherings with Friends and Family: More than three times a week   Attends Religious Services: Not on Scientist, clinical (histocompatibility and immunogenetics)file   Active Member of Clubs or Organizations: Not on file    Attends Engineer, structuralClub or Organization Meetings: More than 4 times per year   Marital Status: Married  Catering managerntimate Partner Violence: Not At Risk   Fear of Current or Ex-Partner: No   Emotionally Abused: No   Physically Abused: No   Sexually Abused: No    Outpatient Medications Prior to Visit  Medication Sig Dispense Refill   Biotin 10 MG TABS Take by mouth.     Calcium Carbonate Antacid (CALCIUM CARBONATE, DOSED IN MG ELEMENTAL CALCIUM,) 1250 MG/5ML SUSP Take by mouth.     Cholecalciferol (VITAMIN D3) 10 MCG (400 UNIT) CAPS Take by mouth.     levothyroxine (SYNTHROID) 75 MCG tablet Take 1 tablet (75 mcg total) by mouth daily. 90 tablet 3   Red Yeast Rice Extract 600 MG CAPS Take 600 mg by mouth daily.     Zinc Sulfate (ZINC 15 PO) Take by mouth.     zolpidem (AMBIEN) 10 MG tablet Take 10 mg by mouth at bedtime as needed.     No facility-administered medications prior to visit.    Allergies  Allergen Reactions   Clindamycin Hives   Sulfa Antibiotics Rash   Codeine    Tramadol     hallucinations    Review of Systems  Constitutional:  Negative for chills and fever.  HENT:  Positive for congestion, postnasal drip, sinus pressure, sinus pain, sore throat and voice change. Negative for ear pain.   Respiratory:  Negative for cough, shortness of breath and wheezing.   Cardiovascular:  Negative for chest pain and palpitations.      Objective:    Physical Exam Constitutional:      General: She is not in acute distress.    Appearance: Normal appearance. She is normal weight. She is not ill-appearing, toxic-appearing or diaphoretic.  Pulmonary:     Effort: Pulmonary effort is normal.  Neurological:     General: No focal deficit present.     Mental Status: She is alert and oriented to person, place, and time.  Psychiatric:        Mood and Affect: Mood normal.        Behavior: Behavior normal.        Thought Content: Thought content normal.        Judgment: Judgment normal.    Temp 97.9  F (36.6 C) (Oral)    Wt 126 lb (57.2 kg)    BMI 23.81 kg/m  Wt Readings from Last 3 Encounters:  06/05/21 126 lb (57.2 kg)  01/26/21 124 lb 9.6 oz (56.5 kg)  12/01/20 123 lb (55.8 kg)       Assessment & Plan:   Problem List Items Addressed This Visit       Respiratory  Acute non-recurrent frontal sinusitis - Primary    Prescription given for augmentin 875/125 mg po bid for ten days. Pt to continue tylenol/ibuprofen prn sinus pain. Continue with humidifier prn and steam showers recommended as well. instructed If no symptom improvement in 48 hours please f/u       Relevant Medications   amoxicillin (AMOXIL) 875 MG tablet    I have discontinued Isobel Careaga's amoxicillin-clavulanate. I am also having her start on amoxicillin. Additionally, I am having her maintain her zolpidem, Red Yeast Rice Extract, levothyroxine, calcium carbonate (dosed in mg elemental calcium), Vitamin D3, Zinc Sulfate (ZINC 15 PO), and Biotin.  Meds ordered this encounter  Medications   DISCONTD: amoxicillin-clavulanate (AUGMENTIN) 875-125 MG tablet    Sig: Take 1 tablet by mouth 2 (two) times daily.    Dispense:  20 tablet    Refill:  0    Order Specific Question:   Supervising Provider    Answer:   BEDSOLE, AMY E [[id number]]   amoxicillin (AMOXIL) 875 MG tablet    Sig: Take 1 tablet (875 mg total) by mouth 2 (two) times daily for 10 days.    Dispense:  20 tablet    Refill:  0    Order Specific Question:   Supervising Provider    Answer:   BEDSOLE, AMY E [[id number]]    I discussed the assessment and treatment plan with the patient. The patient was provided an opportunity to ask questions and all were answered. The patient agreed with the plan and demonstrated an understanding of the instructions.   The patient was advised to call back or seek an in-person evaluation if the symptoms worsen or if the condition fails to improve as anticipated.  I provided 18 minutes of face-to-face time during this  encounter.   Eugenia Pancoast, [facility] [place] [phone] (phone) [phone] (fax)  [place]

## 2021-06-05 NOTE — Patient Instructions (Signed)
Recommend flonase daily as well.   Start prescription of augmentin 875/125 mg and take as prescribed.  Tylenol/ibuprofen ok for sinus pain as needed Increase oral fluids. Ok to continue with humidifers and hot steamy showers as discussed during visit.  It was a pleasure speaking with you today, I hope you start feeling better soon.  Regards,   Mort Sawyers

## 2021-06-20 ENCOUNTER — Other Ambulatory Visit (INDEPENDENT_AMBULATORY_CARE_PROVIDER_SITE_OTHER): Payer: Medicare Other

## 2021-06-20 ENCOUNTER — Other Ambulatory Visit: Payer: Self-pay

## 2021-06-20 DIAGNOSIS — E7800 Pure hypercholesterolemia, unspecified: Secondary | ICD-10-CM

## 2021-06-20 LAB — LIPID PANEL
Cholesterol: 213 mg/dL — ABNORMAL HIGH (ref 0–200)
HDL: 75.3 mg/dL (ref >39.00)
LDL Cholesterol: 113 mg/dL — ABNORMAL HIGH (ref 0–99)
NonHDL: 137.56
Total CHOL/HDL Ratio: 3
Triglycerides: 122 mg/dL (ref 0.0–149.0)
VLDL: 24.4 mg/dL (ref 0.0–40.0)

## 2021-06-20 LAB — COMPREHENSIVE METABOLIC PANEL
ALT: 21 U/L (ref 0–35)
AST: 24 U/L (ref 0–37)
Albumin: 4.6 g/dL (ref 3.5–5.2)
Alkaline Phosphatase: 56 U/L (ref 39–117)
BUN: 16 mg/dL (ref 6–23)
CO2: 35 mEq/L — ABNORMAL HIGH (ref 19–32)
Calcium: 10 mg/dL (ref 8.4–10.5)
Chloride: 103 mEq/L (ref 96–112)
Creatinine, Ser: 0.76 mg/dL (ref 0.40–1.20)
GFR: 73.67 mL/min (ref >60.00)
Glucose, Bld: 93 mg/dL (ref 70–99)
Potassium: 4.9 mEq/L (ref 3.5–5.1)
Sodium: 140 mEq/L (ref 135–145)
Total Bilirubin: 1 mg/dL (ref 0.2–1.2)
Total Protein: 6.9 g/dL (ref 6.0–8.3)

## 2021-06-21 ENCOUNTER — Encounter: Payer: Medicare Other | Admitting: Internal Medicine

## 2021-06-21 ENCOUNTER — Other Ambulatory Visit: Payer: Self-pay

## 2021-06-21 ENCOUNTER — Encounter: Payer: Self-pay | Admitting: Internal Medicine

## 2021-06-21 ENCOUNTER — Ambulatory Visit (INDEPENDENT_AMBULATORY_CARE_PROVIDER_SITE_OTHER): Payer: Medicare Other | Admitting: Internal Medicine

## 2021-06-21 DIAGNOSIS — E7800 Pure hypercholesterolemia, unspecified: Secondary | ICD-10-CM | POA: Diagnosis not present

## 2021-06-21 DIAGNOSIS — Z0000 Encounter for general adult medical examination without abnormal findings: Secondary | ICD-10-CM | POA: Insufficient documentation

## 2021-06-21 DIAGNOSIS — R232 Flushing: Secondary | ICD-10-CM | POA: Diagnosis not present

## 2021-06-21 DIAGNOSIS — Z853 Personal history of malignant neoplasm of breast: Secondary | ICD-10-CM

## 2021-06-21 DIAGNOSIS — E039 Hypothyroidism, unspecified: Secondary | ICD-10-CM

## 2021-06-21 DIAGNOSIS — Z8616 Personal history of COVID-19: Secondary | ICD-10-CM

## 2021-06-21 NOTE — Progress Notes (Signed)
Patient ID: Kayla Navarro, female   DOB: Nov 28, 1940, 81 y.o.   MRN: [id number]   Subjective:    Patient ID: Kayla Navarro, female    DOB: 1941/01/13, 81 y.o.   MRN: [id number]  This visit occurred during the SARS-CoV-2 public health emergency.  Safety protocols were in place, including screening questions prior to the visit, additional usage of staff PPE, and extensive cleaning of exam room while observing appropriate contact time as indicated for disinfecting solutions.    HPI With past history of hypercholesterolemia, hypothyroidism and hypertension.  She comes in today to follow up on these issues as well as for a complete physical exam.  Had covid 05/25/21.  Treated with molnupiravir.  Had continued sinud pressure/nasal congestion with associated laryngitis.  Was diagnosed with sinusitis.  Treated with augmentin.  Reports still having issues.  Feels better overall, but still with what feels like "brain fog".  Some nasal dripping.  No sore throat.  Ears feel "weird".  Increased drainage.  Felt light headed yesterday.  Just still feels a little off.  No vertigo.  No chest pain.  Did Tai Chi this am.  No increased cough.  No vomiting or diarrhea.  Eating.    Past Medical History:  Diagnosis Date   Degenerative disc disease, cervical    GERD (gastroesophageal reflux disease)    History of colon polyps    History of hypertension    Hypercholesteremia    Hypothyroid    Past Surgical History:  Procedure Laterality Date   AUGMENTATION MAMMAPLASTY Left 2009   transflap done on mastectomy    BREAST BIOPSY Right    1990's neg   CATARACT EXTRACTION Right 2015   MASTECTOMY  2002   ROTATOR CUFF REPAIR     TONSILLECTOMY AND ADENOIDECTOMY     TRIGGER FINGER RELEASE     Family History  Problem Relation Age of Onset   Stomach cancer Mother    Hypertension Mother    CAD Mother    Hypercholesterolemia Mother    Parkinson's disease Father    Heart disease Father    High Cholesterol Father     Heart disease Brother    Parkinson's disease Paternal Grandmother    Social History   Socioeconomic History   Marital status: Married    Spouse name: Not on file   Number of children: Not on file   Years of education: Not on file   Highest education level: Not on file  Occupational History   Not on file  Tobacco Use   Smoking status: Never   Smokeless tobacco: Never  Substance and Sexual Activity   Alcohol use: Yes    Comment: occasional   Drug use: Never   Sexual activity: Not Currently  Other Topics Concern   Not on file  Social History Narrative   Not on file   Social Determinants of Health   Financial Resource Strain: Low Risk    Difficulty of Paying Living Expenses: Not hard at all  Food Insecurity: No Food Insecurity   Worried About Charity fundraiser in the Last Year: Never true   [place] in the Last Year: Never true  Transportation Needs: No Transportation Needs   Lack of Transportation (Medical): No   Lack of Transportation (Non-Medical): No  Physical Activity: Not on file  Stress: No Stress Concern Present   Feeling of Stress : Not at all  Social Connections: Unknown   Frequency of Communication with Friends and Family: More than  three times a week   Frequency of Social Gatherings with Friends and Family: More than three times a week   Attends Religious Services: Not on file   Active Member of Clubs or Organizations: Not on file   Attends Archivist Meetings: More than 4 times per year   Marital Status: Married     Review of Systems  Constitutional:  Positive for fatigue. Negative for appetite change and unexpected weight change.  HENT:  Positive for congestion and postnasal drip. Negative for sore throat.   Eyes:  Negative for pain and visual disturbance.  Respiratory:  Negative for cough, chest tightness and shortness of breath.   Cardiovascular:  Negative for chest pain, palpitations and leg swelling.  Gastrointestinal:  Negative  for abdominal pain, diarrhea, nausea and vomiting.  Genitourinary:  Negative for difficulty urinating and dysuria.  Musculoskeletal:  Negative for joint swelling and myalgias.  Skin:  Negative for color change and rash.  Neurological:  Positive for light-headedness. Negative for dizziness and headaches.  Hematological:  Negative for adenopathy. Does not bruise/bleed easily.  Psychiatric/Behavioral:  Negative for agitation and dysphoric mood.       Objective:     BP 122/70    Pulse 80    Temp 97.9 F (36.6 C)    Resp 16    Ht '5\' 1"'  (1.549 m)    Wt 127 lb (57.6 kg)    SpO2 98%    BMI 24.00 kg/m  Wt Readings from Last 3 Encounters:  06/21/21 127 lb (57.6 kg)  06/05/21 126 lb (57.2 kg)  01/26/21 124 lb 9.6 oz (56.5 kg)    Physical Exam Vitals reviewed.  Constitutional:      General: She is not in acute distress.    Appearance: Normal appearance. She is well-developed.  HENT:     Head: Normocephalic and atraumatic.     Right Ear: External ear normal.     Left Ear: External ear normal.  Eyes:     General: No scleral icterus.       Right eye: No discharge.        Left eye: No discharge.     Conjunctiva/sclera: Conjunctivae normal.  Neck:     Thyroid: No thyromegaly.  Cardiovascular:     Rate and Rhythm: Normal rate and regular rhythm.  Pulmonary:     Effort: No tachypnea, accessory muscle usage or respiratory distress.     Breath sounds: Normal breath sounds. No decreased breath sounds or wheezing.  Chest:  Breasts:    Right: No inverted nipple, mass, nipple discharge or tenderness (no axillary adenopathy).     Left: No inverted nipple, mass, nipple discharge or tenderness (no axilarry adenopathy).  Abdominal:     General: Bowel sounds are normal.     Palpations: Abdomen is soft.     Tenderness: There is no abdominal tenderness.  Musculoskeletal:        General: No swelling or tenderness.     Cervical back: Neck supple.  Lymphadenopathy:     Cervical: No cervical  adenopathy.  Skin:    Findings: No erythema or rash.  Neurological:     Mental Status: She is alert and oriented to person, place, and time.  Psychiatric:        Mood and Affect: Mood normal.        Behavior: Behavior normal.     Outpatient Encounter Medications as of 06/21/2021  Medication Sig   Biotin 10 MG TABS Take by mouth.  Calcium Carbonate Antacid (CALCIUM CARBONATE, DOSED IN MG ELEMENTAL CALCIUM,) 1250 MG/5ML SUSP Take by mouth.   Cholecalciferol (VITAMIN D3) 10 MCG (400 UNIT) CAPS Take by mouth.   levothyroxine (SYNTHROID) 75 MCG tablet Take 1 tablet (75 mcg total) by mouth daily.   Red Yeast Rice Extract 600 MG CAPS Take 600 mg by mouth daily.   Zinc Sulfate (ZINC 15 PO) Take by mouth.   zolpidem (AMBIEN) 10 MG tablet Take 10 mg by mouth at bedtime as needed.   No facility-administered encounter medications on file as of 06/21/2021.     Lab Results  Component Value Date   WBC 4.8 12/01/2020   HGB 13.8 12/01/2020   HCT 41.4 12/01/2020   PLT 178.0 12/01/2020   GLUCOSE 93 06/20/2021   CHOL 213 (H) 06/20/2021   TRIG 122.0 06/20/2021   HDL 75.30 06/20/2021   LDLCALC 113 (H) 06/20/2021   ALT 21 06/20/2021   AST 24 06/20/2021   NA 140 06/20/2021   K 4.9 06/20/2021   CL 103 06/20/2021   CREATININE 0.76 06/20/2021   BUN 16 06/20/2021   CO2 35 (H) 06/20/2021   TSH 0.61 12/01/2020    MM DIAG BREAST TOMO UNI RIGHT  Result Date: 03/30/2021 CLINICAL DATA:  Right architectural distortion recommended for biopsy. The patient did not want to go through with the biopsy. EXAM: DIGITAL DIAGNOSTIC UNILATERAL RIGHT MAMMOGRAM WITH TOMOSYNTHESIS AND CAD TECHNIQUE: Right digital diagnostic mammography and breast tomosynthesis was performed. The images were evaluated with computer-aided detection. COMPARISON:  Previous exam(s). ACR Breast Density Category c: The breast tissue is heterogeneously dense, which may obscure small masses. FINDINGS: Cc and MLO views of the right breast are  submitted. There is persistent architectural distortion in the subareolar right breast. IMPRESSION: Persistent distortion in the subareolar right breast unchanged compared prior exams. RECOMMENDATION: Stereotactic core biopsy of distortion of subareolar right breast. I have discussed the findings and recommendations with the patient. If applicable, a reminder letter will be sent to the patient regarding the next appointment. BI-RADS CATEGORY  4: Suspicious. Electronically Signed   By: Abelardo Diesel M.D.   On: 03/30/2021 11:27      Assessment & Plan:   Problem List Items Addressed This Visit     Healthcare maintenance    Physical today 06/21/21.  Mammogram as outlined.  Need copy of last colonoscopy.        History of breast cancer    Evaluated by Dr Bary Castilla 10/03/20.  Recommended f/u bilateral diagnostic mammogram in 6 months.  Saw Dr Iris Pert - f/u regarding breast changes and discomfort s/p surgery.  Recommended follow.  Need office notes.       History of COVID-19    Recent covid infection.  Feels better, but persistent symptoms as outlined.  Discussed residual symptoms of covid.  Discussed further evaluation.  Wants to monitor.  Did Tai Chi this am.  Follow. Notify me if persistent symptoms of if change in symptoms.       Hot flashes    Question of hot flashes.  Previously on femara.  Initially thought symptoms related to femara. Has been off femara.  Has continued to have variety of symptoms as outlined.  Have discussed another trial of SSRI.  Has tried gabapentin.  discussed endocrinology referral for further w/up.  Wants to monitor.  Will notify me if desires further intervention.        Hypercholesteremia    Low cholesterol diet and exercise.  Follow lipid panel.  Check lipid panel today.       Hypothyroidism    On thyroid replacement.  Follow tsh.         Einar Pheasant, MD

## 2021-06-25 ENCOUNTER — Encounter: Payer: Self-pay | Admitting: Internal Medicine

## 2021-06-25 ENCOUNTER — Telehealth: Payer: Self-pay | Admitting: Internal Medicine

## 2021-06-25 NOTE — Assessment & Plan Note (Signed)
On thyroid replacement.  Follow tsh.  

## 2021-06-25 NOTE — Telephone Encounter (Signed)
Per our conversation, I though she also told me she saw Dr Marla Roe.  Can confirm with pt.

## 2021-06-25 NOTE — Assessment & Plan Note (Signed)
Low cholesterol diet and exercise.  Follow lipid panel  Check lipid panel today.  

## 2021-06-25 NOTE — Assessment & Plan Note (Signed)
Question of hot flashes.  Previously on femara.  Initially thought symptoms related to femara. Has been off femara.  Has continued to have variety of symptoms as outlined.  Have discussed another trial of SSRI.  Has tried gabapentin.  discussed endocrinology referral for further w/up.  Wants to monitor.  Will notify me if desires further intervention.

## 2021-06-25 NOTE — Telephone Encounter (Signed)
Pt reported she recently saw Dr Westley Foots for f/u abnormal mammogram.  Has history of breast cancer.  Need records.  Also need copy of her last colonoscopy.  Still have not received.  Thanks.

## 2021-06-25 NOTE — Assessment & Plan Note (Signed)
Evaluated by Dr Lemar Livings 10/03/20.  Recommended f/u bilateral diagnostic mammogram in 6 months.  Saw Dr Westley Foots - f/u regarding breast changes and discomfort s/p surgery.  Recommended follow.  Need office notes.

## 2021-06-25 NOTE — Assessment & Plan Note (Signed)
Recent covid infection.  Feels better, but persistent symptoms as outlined.  Discussed residual symptoms of covid.  Discussed further evaluation.  Wants to monitor.  Did Tai Chi this am.  Follow. Notify me if persistent symptoms of if change in symptoms.

## 2021-06-25 NOTE — Assessment & Plan Note (Signed)
Physical today 06/21/21.  Mammogram as outlined.  Need copy of last colonoscopy.

## 2021-06-25 NOTE — Telephone Encounter (Signed)
Patient saw Dr Lemar Livings 03/2021 for abnormal mammo with recommendation of f/u diagnostic in 1 year in Care Everywhere. Will request colonoscopy.

## 2021-06-26 NOTE — Telephone Encounter (Signed)
Could not reach patient °

## 2021-07-02 ENCOUNTER — Ambulatory Visit: Payer: PRIVATE HEALTH INSURANCE

## 2021-07-03 NOTE — Telephone Encounter (Signed)
Could not reach patient to find out where last colonoscopy was done. ?

## 2021-07-04 NOTE — Telephone Encounter (Signed)
Unable to reach, will note to discuss at April at appt. ?

## 2021-07-16 ENCOUNTER — Ambulatory Visit (INDEPENDENT_AMBULATORY_CARE_PROVIDER_SITE_OTHER): Payer: Medicare Other

## 2021-07-16 VITALS — Ht 61.0 in | Wt 127.0 lb

## 2021-07-16 DIAGNOSIS — Z0000 Encounter for general adult medical examination without abnormal findings: Secondary | ICD-10-CM | POA: Diagnosis not present

## 2021-07-16 NOTE — Progress Notes (Signed)
Subjective:   Kayla Navarro is a 81 y.o. female who presents for Medicare Annual (Subsequent) preventive examination.  Review of Systems    No ROS.  Medicare Wellness Virtual Visit.  Visual/audio telehealth visit, UTA vital signs.   See social history for additional risk factors.   Cardiac Risk Factors include: advanced age (>70men, >85 women)     Objective:    Today's Vitals   07/16/21 1021  Weight: 127 lb (57.6 kg)  Height: 5\' 1"  (1.549 m)   Body mass index is 24 kg/m.  Advanced Directives 07/16/2021 06/29/2020  Does Patient Have a Medical Advance Directive? Yes Yes  Type of Estate agent of [place];Living will Healthcare Power of [place];Living will  Does patient want to make changes to medical advance directive? No - Patient declined No - Patient declined  Copy of Healthcare Power of Attorney in Chart? No - copy requested No - copy requested   Current Medications (verified) Outpatient Encounter Medications as of 07/16/2021  Medication Sig   Biotin 10 MG TABS Take by mouth.   Calcium Carbonate Antacid (CALCIUM CARBONATE, DOSED IN MG ELEMENTAL CALCIUM,) 1250 MG/5ML SUSP Take by mouth.   Cholecalciferol (VITAMIN D3) 10 MCG (400 UNIT) CAPS Take by mouth.   levothyroxine (SYNTHROID) 75 MCG tablet Take 1 tablet (75 mcg total) by mouth daily.   Red Yeast Rice Extract 600 MG CAPS Take 600 mg by mouth daily.   Zinc Sulfate (ZINC 15 PO) Take by mouth.   zolpidem (AMBIEN) 10 MG tablet Take 10 mg by mouth at bedtime as needed.   No facility-administered encounter medications on file as of 07/16/2021.   Allergies (verified) Clindamycin, Sulfa antibiotics, Codeine, and Tramadol   History: Past Medical History:  Diagnosis Date   Degenerative disc disease, cervical    GERD (gastroesophageal reflux disease)    History of colon polyps    History of hypertension    Hypercholesteremia    Hypothyroid    Past Surgical History:  Procedure Laterality Date    AUGMENTATION MAMMAPLASTY Left 2009   transflap done on mastectomy    BREAST BIOPSY Right    1990's neg   CATARACT EXTRACTION Right 2015   MASTECTOMY  2002   ROTATOR CUFF REPAIR     TONSILLECTOMY AND ADENOIDECTOMY     TRIGGER FINGER RELEASE     Family History  Problem Relation Age of Onset   Stomach cancer Mother    Hypertension Mother    CAD Mother    Hypercholesterolemia Mother    Parkinson's disease Father    Heart disease Father    High Cholesterol Father    Heart disease Brother    Parkinson's disease Paternal Grandmother    Social History   Socioeconomic History   Marital status: Married    Spouse name: Not on file   Number of children: Not on file   Years of education: Not on file   Highest education level: Not on file  Occupational History   Not on file  Tobacco Use   Smoking status: Never   Smokeless tobacco: Never  Substance and Sexual Activity   Alcohol use: Yes    Comment: occasional   Drug use: Never   Sexual activity: Not Currently  Other Topics Concern   Not on file  Social History Narrative   Not on file   Social Determinants of Health   Financial Resource Strain: Low Risk    Difficulty of Paying Living Expenses: Not hard at all  Food  Insecurity: No Food Insecurity   Worried About Programme researcher, broadcasting/film/video in the Last Year: Never true   Ran Out of Food in the Last Year: Never true  Transportation Needs: No Transportation Needs   Lack of Transportation (Medical): No   Lack of Transportation (Non-Medical): No  Physical Activity: Not on file  Stress: No Stress Concern Present   Feeling of Stress : Not at all  Social Connections: Unknown   Frequency of Communication with Friends and Family: More than three times a week   Frequency of Social Gatherings with Friends and Family: More than three times a week   Attends Religious Services: Not on Scientist, clinical (histocompatibility and immunogenetics) or Organizations: Not on file   Attends Engineer, structural: More than  4 times per year   Marital Status: Married   Tobacco Counseling Counseling given: Not Answered   Clinical Intake:  Pre-visit preparation completed: Yes        Diabetes: No  How often do you need to have someone help you when you read instructions, pamphlets, or other written materials from your doctor or pharmacy?: 1 - Never Interpreter Needed?: No    Activities of Daily Living In your present state of health, do you have any difficulty performing the following activities: 07/16/2021  Hearing? N  Vision? N  Difficulty concentrating or making decisions? N  Walking or climbing stairs? N  Dressing or bathing? N  Doing errands, shopping? N  Preparing Food and eating ? N  Using the Toilet? N  In the past six months, have you accidently leaked urine? N  Do you have problems with loss of bowel control? N  Managing your Medications? N  Managing your Finances? N  Housekeeping or managing your Housekeeping? N  Some recent data might be hidden    Patient Care Team: Dale Olmitz, MD as PCP - General (Internal Medicine)  Indicate any recent Medical Services you may have received from other than [facility] providers in the past year (date may be approximate).     Assessment:   This is a routine wellness examination for Kayla Navarro.  Virtual Visit via Telephone Note  I connected with  [name] on 07/16/21 at 10:15 AM EDT by telephone and verified that I am speaking with the correct person using two identifiers.  Persons participating in the virtual visit: patient/Nurse Health Advisor   I discussed the limitations of performing evaluation and management service by telephone and the availability of in person appointments. The patient expressed understanding and agreed to proceed. We continued and completed visit with audio only.  Some vital signs may be absent or patient reported.   Hearing/Vision screen Hearing Screening - Comments:: Patient is able to hear conversational tones  without difficulty. No issues reported. Vision Screening - Comments:: They have seen their ophthalmologist in the last 12 months.   Dietary issues and exercise activities discussed: Current Exercise Habits: Home exercise routine, Type of exercise: walking (Tai-Chi), Time (Minutes): 60, Frequency (Times/Week): 3, Weekly Exercise (Minutes/Week): 180, Intensity: Mild Healthy diet  Good water intake    Goals Addressed             This Visit's Progress    Follow up with Primary Care Provider       As needed Maintain healthy lifestyle       Depression Screen PHQ 2/9 Scores 07/16/2021 12/01/2020 06/29/2020 02/14/2020  PHQ - 2 Score 0 0 0 0    Fall Risk Fall Risk  07/16/2021 12/01/2020 06/29/2020 11/05/2019  Falls in the past year? 0 1 0 0  Number falls in past yr: 0 1 0 -  Injury with Fall? - 0 0 -  Follow up Falls evaluation completed Falls evaluation completed Falls evaluation completed Falls evaluation completed    FALL RISK PREVENTION PERTAINING TO THE HOME: Home free of loose throw rugs in walkways, pet beds, electrical cords, etc? Yes  Adequate lighting in your home to reduce risk of falls? Yes   ASSISTIVE DEVICES UTILIZED TO PREVENT FALLS: Life alert? No  Use of a cane, walker or w/c? No   TIMED UP AND GO: Was the test performed? No .   Cognitive Function:  Patient is alert and oriented x3.  Normal cognitive status assessed by direct observation/communication by this Nurse Health Advisor. No abnormalities found.       Immunizations Immunization History  Administered Date(s) Administered   Fluad Quad(high Dose 65+) 04/05/2021   Hep A / Hep B 01/06/2008   Influenza, High Dose Seasonal PF 04/05/2015, 02/01/2016, 02/17/2017, 03/31/2018   Influenza-Unspecified 01/28/2019   Moderna Sars-Covid-2 Vaccination 05/10/2018, 06/08/2019, 05/05/2020   Pneumococcal Conjugate-13 05/26/2018   Pneumococcal Polysaccharide-23 02/10/2012   Tdap 01/06/2008   Zoster, Live 01/05/2009   Bone  density- declined per patient at this time.   Screening Tests Health Maintenance  Topic Date Due   TETANUS/TDAP  12/01/2021 (Originally 01/05/2018)   COVID-19 Vaccine (4 - Booster for Moderna series) 12/27/2021 (Originally 06/30/2020)   Zoster Vaccines- Shingrix (1 of 2) 03/28/2022 (Originally 05/28/1959)   DEXA SCAN  07/17/2022 (Originally 05/27/2005)   Pneumonia Vaccine 93+ Years old  Completed   INFLUENZA VACCINE  Completed   HPV VACCINES  Aged Out   Health Maintenance There are no preventive care reminders to display for this patient.  Mammogram- completed 03/30/21.   Lung Cancer Screening: (Low Dose CT Chest recommended if Age 39-80 years, 30 pack-year currently smoking OR have quit w/in 15years.) does not qualify.   Hepatitis C Screening: does not qualify.  Vision Screening: Recommended annual ophthalmology exams for early detection of glaucoma and other disorders of the eye.  Dental Screening: Recommended annual dental exams for proper oral hygiene  Community Resource Referral / Chronic Care Management: CRR required this visit?  No   CCM required this visit?  No      Plan:   Keep all routine maintenance appointments.   I have personally reviewed and noted the following in the patient's chart:   Medical and social history Use of alcohol, tobacco or illicit drugs  Current medications and supplements including opioid prescriptions.  Functional ability and status Nutritional status Physical activity Advanced directives List of other physicians Hospitalizations, surgeries, and ER visits in previous 12 months Vitals Screenings to include cognitive, depression, and falls Referrals and appointments  In addition, I have reviewed and discussed with patient certain preventive protocols, quality metrics, and best practice recommendations. A written personalized care plan for preventive services as well as general preventive health recommendations were provided to patient via  mychart.     Ashok Pall, LPN   [id number]

## 2021-07-16 NOTE — Patient Instructions (Addendum)
?  Ms. Renken , ?Thank you for taking time to come for your Medicare Wellness Visit. I appreciate your ongoing commitment to your health goals. Please review the following plan we discussed and let me know if I can assist you in the future.  ? ?These are the goals we discussed: ? Goals   ? ?  Follow up with Primary Care Provider   ?  As needed ?Maintain healthy lifestyle ?  ? ?  ?  ?This is a list of the screening recommended for you and due dates:  ?Health Maintenance  ?Topic Date Due  ? Tetanus Vaccine  12/01/2021*  ? COVID-19 Vaccine (4 - Booster for Moderna series) 12/27/2021*  ? Zoster (Shingles) Vaccine (1 of 2) 03/28/2022*  ? DEXA scan (bone density measurement)  07/17/2022*  ? Pneumonia Vaccine  Completed  ? Flu Shot  Completed  ? HPV Vaccine  Aged Out  ?*Topic was postponed. The date shown is not the original due date.  ?  ?

## 2021-08-06 ENCOUNTER — Ambulatory Visit: Payer: Medicare Other | Admitting: Internal Medicine

## 2021-08-20 ENCOUNTER — Other Ambulatory Visit: Payer: Self-pay

## 2021-08-20 ENCOUNTER — Telehealth: Payer: Self-pay | Admitting: Internal Medicine

## 2021-08-20 MED ORDER — LEVOTHYROXINE SODIUM 75 MCG PO TABS: 75.0000 ug | ORAL_TABLET | Freq: Every day | ORAL | 3 refills | Status: DC

## 2021-08-20 NOTE — Telephone Encounter (Signed)
sent 

## 2021-08-20 NOTE — Telephone Encounter (Signed)
Patient is requesting a refill on her levothyroxine (SYNTHROID) 75 MCG tablet. Please send to mail order Pharmacy. ?

## 2022-03-04 ENCOUNTER — Other Ambulatory Visit: Payer: Self-pay | Admitting: General Surgery

## 2022-03-04 DIAGNOSIS — Z853 Personal history of malignant neoplasm of breast: Secondary | ICD-10-CM

## 2022-04-01 ENCOUNTER — Ambulatory Visit
Admission: RE | Admit: 2022-04-01 | Discharge: 2022-04-01 | Disposition: A | Discharge status: Home / Self Care | Payer: Medicare Other | Source: Ambulatory Visit | Attending: General Surgery | Admitting: General Surgery

## 2022-04-01 ENCOUNTER — Other Ambulatory Visit: Payer: Self-pay | Admitting: General Surgery

## 2022-04-01 DIAGNOSIS — Z853 Personal history of malignant neoplasm of breast: Secondary | ICD-10-CM | POA: Insufficient documentation

## 2022-04-01 DIAGNOSIS — R928 Other abnormal and inconclusive findings on diagnostic imaging of breast: Secondary | ICD-10-CM

## 2022-04-03 ENCOUNTER — Telehealth: Payer: Self-pay

## 2022-04-03 NOTE — Telephone Encounter (Signed)
Patient states her mammogram results show no change, so she is wondering if she needs to keep her appointment with Dr. Donnalee Curry on 04/09/2022.  Patient states she would also like to know if she can start getting her mammogram once a year instead of every six months.  Patient requests a call back.

## 2022-04-03 NOTE — Telephone Encounter (Signed)
Pt advised best to discuss with Dr Lemar Livings

## 2022-04-28 IMAGING — MG DIGITAL SCREENING UNILAT RIGHT W/ TOMO W/ CAD
4 series · 4 of 12 positions shown · non-contrast
Comparison: Previous exam(s).
COMPARISON: Previous exam(s).

Addendum:
CLINICAL DATA: Screening. Status post LEFT mastectomy.

EXAM:
DIGITAL SCREENING UNILATERAL RIGHT MAMMOGRAM WITH CAD AND TOMO

[R MLO synth-2D]
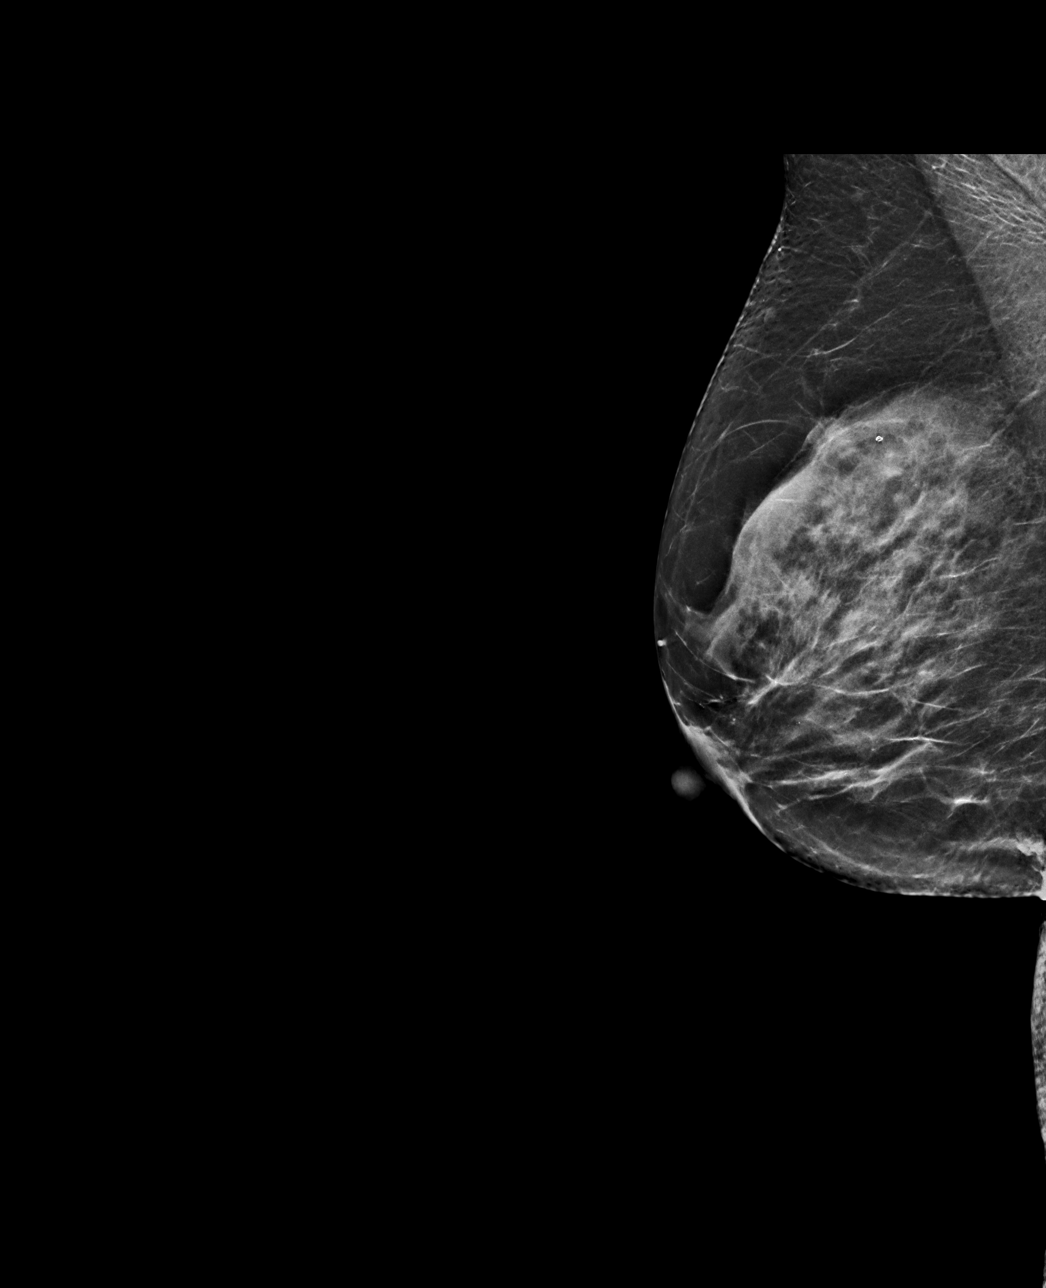

[R CC synth-2D]
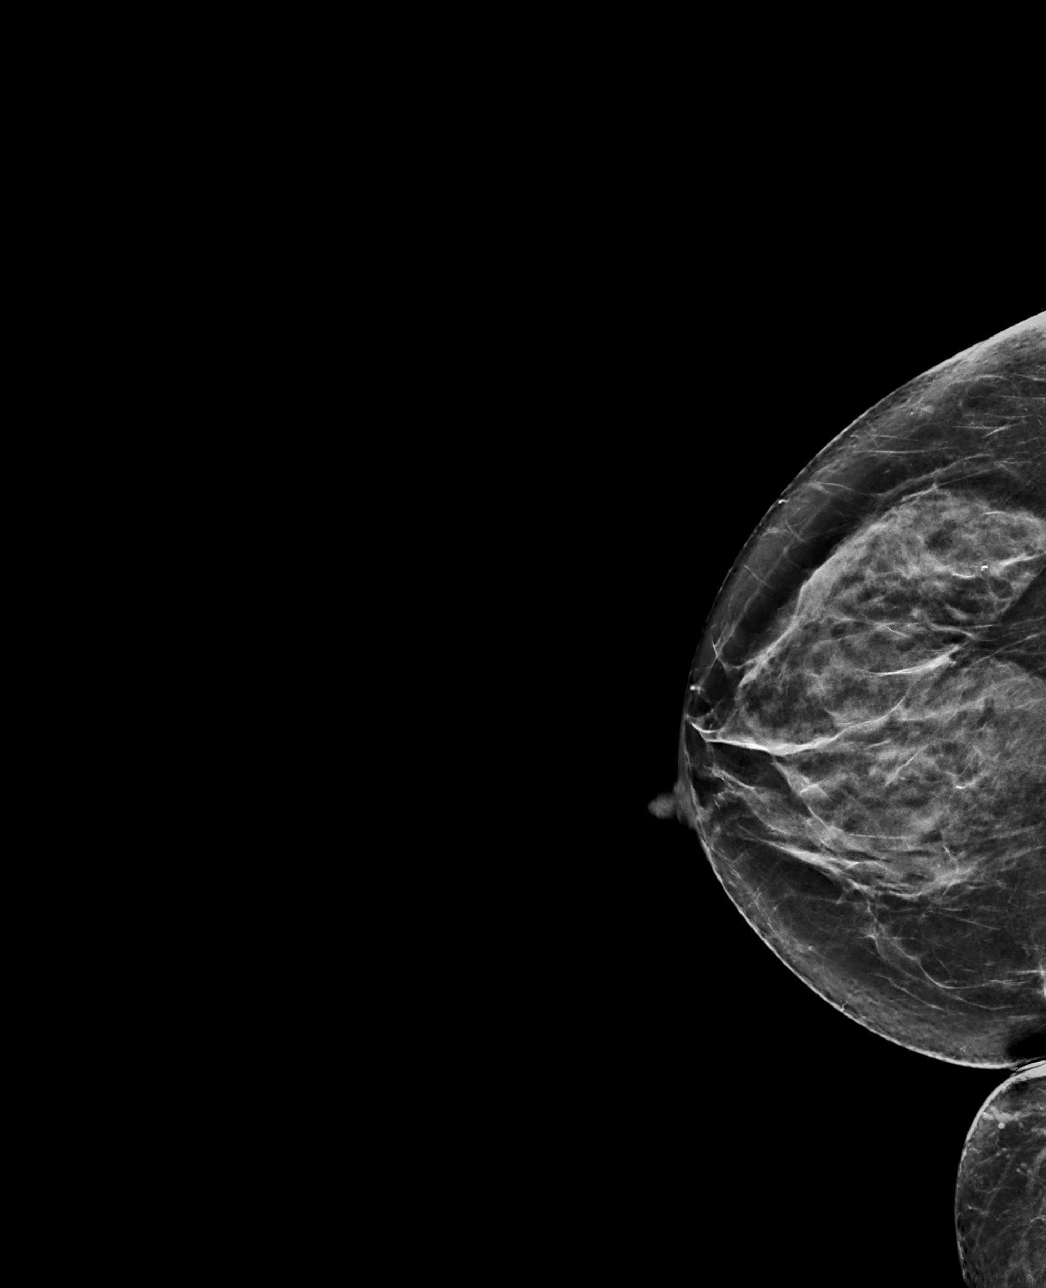

[R MLO tomo · tomo slice 33/65.0]
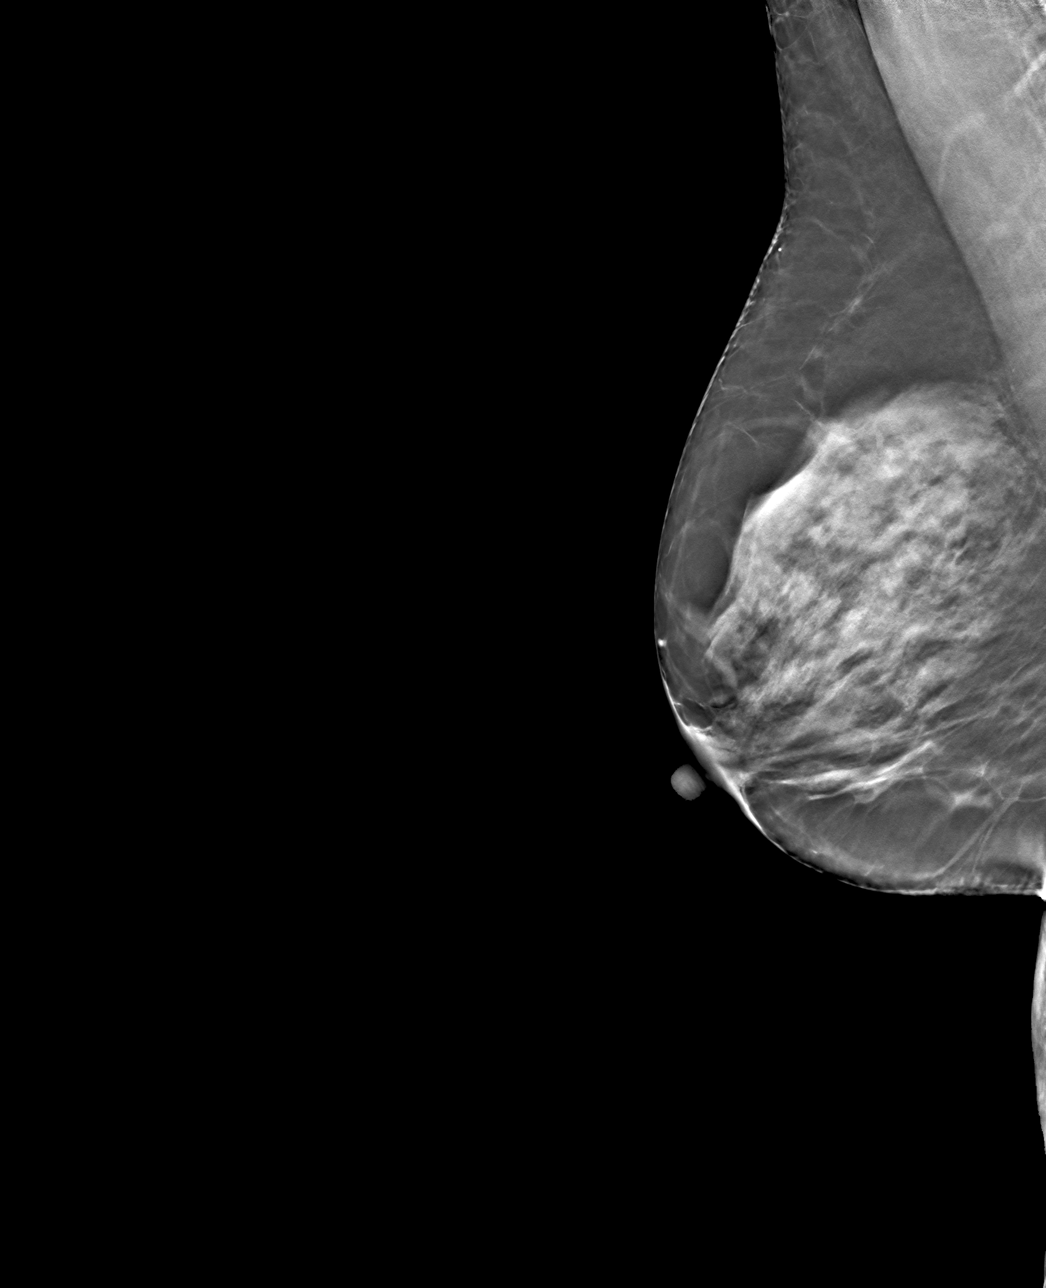

[R CC tomo · tomo slice 32/63.0]
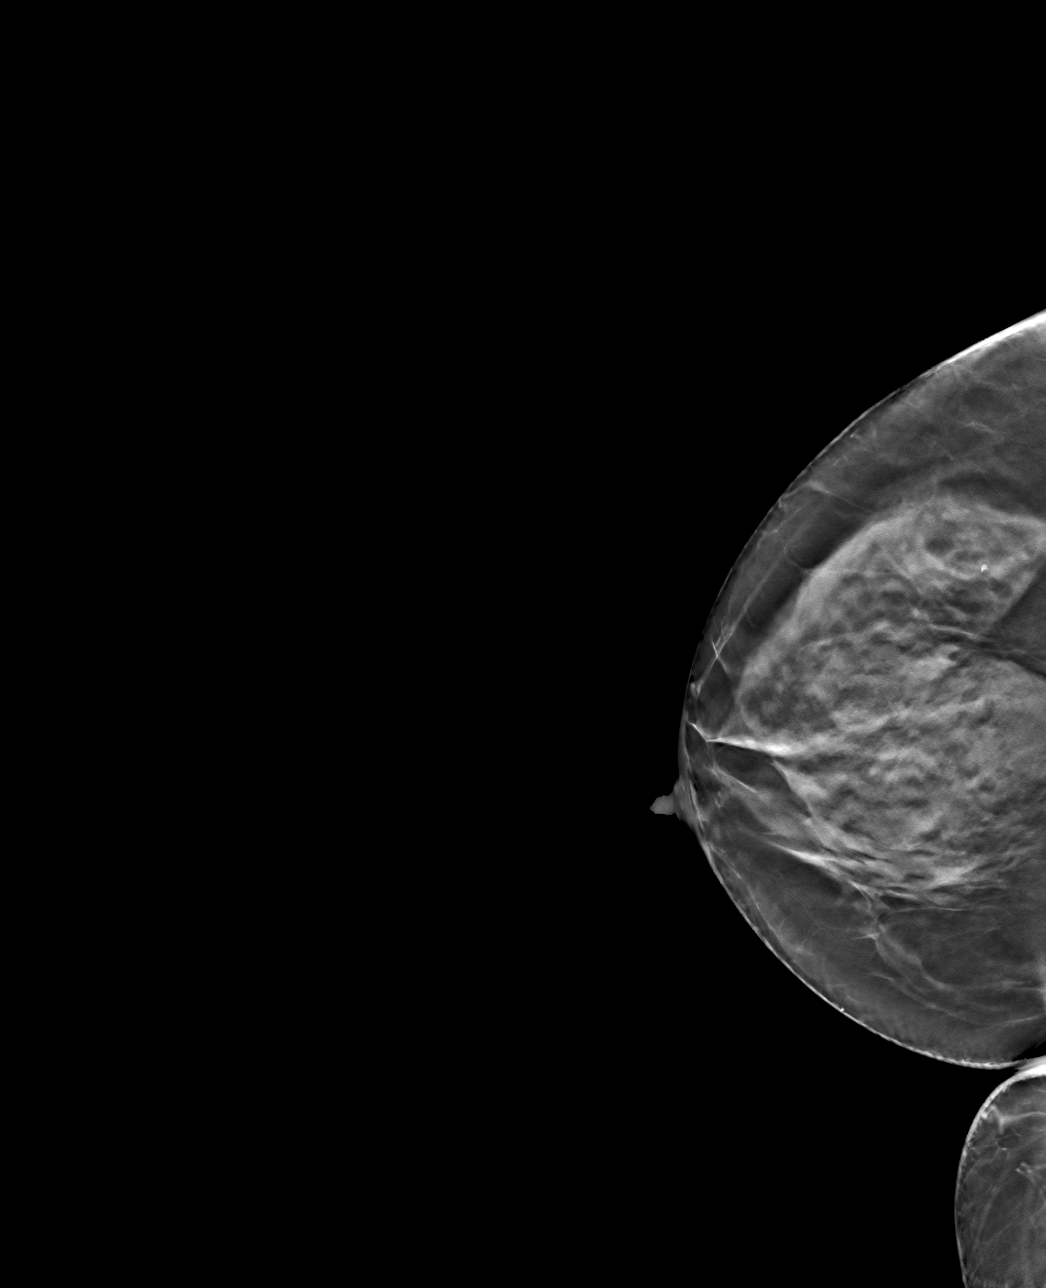

[4 of 12 positions shown; findings below may reference images not displayed]

ACR Breast Density Category c: The breast tissue is heterogeneously
dense, which may obscure small masses.
FINDINGS: In the right breast, possible distortion seen on MLO view warrants
further evaluation. In the left breast, no findings suspicious for
malignancy. Images were processed with CAD.
IMPRESSION: Further evaluation is suggested for possible distortion in the right
breast.

RECOMMENDATION:
Diagnostic mammogram and possibly ultrasound of the right breast.
(Code:ZN-5-44R)

The patient will be contacted regarding the findings, and additional
imaging will be scheduled.

BI-RADS CATEGORY  0: Incomplete. Need additional imaging evaluation
and/or prior mammograms for comparison.

ADDENDUM:
Correction: Patient is status post left mastectomy and this side was
not imaged.

*** End of Addendum ***
ACR Breast Density Category c: The breast tissue is heterogeneously
dense, which may obscure small masses.
FINDINGS: In the right breast, possible distortion seen on MLO view warrants
further evaluation. In the left breast, no findings suspicious for
malignancy. Images were processed with CAD.
IMPRESSION: Further evaluation is suggested for possible distortion in the right
breast.

RECOMMENDATION:
Diagnostic mammogram and possibly ultrasound of the right breast.
(Code:ZN-5-44R)

The patient will be contacted regarding the findings, and additional
imaging will be scheduled.

BI-RADS CATEGORY  0: Incomplete. Need additional imaging evaluation
and/or prior mammograms for comparison.

## 2022-05-20 ENCOUNTER — Other Ambulatory Visit: Payer: Self-pay

## 2022-05-20 ENCOUNTER — Telehealth: Payer: Self-pay | Admitting: Internal Medicine

## 2022-05-20 MED ORDER — LEVOTHYROXINE SODIUM 75 MCG PO TABS: 75.0000 ug | ORAL_TABLET | Freq: Every day | ORAL | 1 refills | Status: DC

## 2022-05-20 NOTE — Telephone Encounter (Signed)
Medication refilled

## 2022-05-20 NOTE — Telephone Encounter (Signed)
Patient has changed Rx insurance, see new card in documents. Patient is requesting a refill on her levothyroxine (SYNTHROID) 75 MCG tablet. Please send to mail order pharmacy.

## 2022-05-21 ENCOUNTER — Encounter: Payer: Self-pay | Admitting: Internal Medicine

## 2022-05-24 ENCOUNTER — Telehealth: Payer: Self-pay | Admitting: Internal Medicine

## 2022-05-24 NOTE — Telephone Encounter (Signed)
Prescription Request  05/24/2022  Is this a "Controlled Substance" medicine? No  LOV: 06/21/2021  What is the name of the medication or equipment?  levothyroxine (SYNTHROID) 75 MCG tablet   Have you contacted your pharmacy to request a refill? Yes   Which pharmacy would you like this sent to?   [facility] [phone]     Patient notified that their request is being sent to the clinical staff for review and that they should receive a response within 2 business days.   Please advise at Mobile [phone] (mobile)    As per pt, she changed pharmacy so this will be the new one. She also stated that her meds was sent to the wrong pharmacy, so just need to be re-sent to this one.

## 2022-05-27 ENCOUNTER — Other Ambulatory Visit: Payer: Self-pay

## 2022-05-27 MED ORDER — LEVOTHYROXINE SODIUM 75 MCG PO TABS: 75.0000 ug | ORAL_TABLET | Freq: Every day | ORAL | 1 refills | Status: DC

## 2022-05-27 NOTE — Telephone Encounter (Signed)
Pt want an update on medication refill

## 2022-05-27 NOTE — Telephone Encounter (Signed)
[facility] request.

## 2022-06-14 ENCOUNTER — Ambulatory Visit: Payer: Medicare Other | Admitting: Internal Medicine

## 2022-07-15 ENCOUNTER — Telehealth: Payer: Self-pay

## 2022-07-15 ENCOUNTER — Ambulatory Visit (INDEPENDENT_AMBULATORY_CARE_PROVIDER_SITE_OTHER): Payer: Medicare Other | Admitting: Internal Medicine

## 2022-07-15 ENCOUNTER — Encounter: Payer: Self-pay | Admitting: Internal Medicine

## 2022-07-15 VITALS — BP 136/72 | HR 76 | Temp 98.2°F | Resp 16 | Ht 61.5 in | Wt 127.4 lb

## 2022-07-15 DIAGNOSIS — Z853 Personal history of malignant neoplasm of breast: Secondary | ICD-10-CM | POA: Diagnosis not present

## 2022-07-15 DIAGNOSIS — E039 Hypothyroidism, unspecified: Secondary | ICD-10-CM

## 2022-07-15 DIAGNOSIS — R519 Headache, unspecified: Secondary | ICD-10-CM | POA: Diagnosis not present

## 2022-07-15 DIAGNOSIS — E7800 Pure hypercholesterolemia, unspecified: Secondary | ICD-10-CM

## 2022-07-15 DIAGNOSIS — R030 Elevated blood-pressure reading, without diagnosis of hypertension: Secondary | ICD-10-CM

## 2022-07-15 DIAGNOSIS — Z0000 Encounter for general adult medical examination without abnormal findings: Secondary | ICD-10-CM

## 2022-07-15 DIAGNOSIS — Z8589 Personal history of malignant neoplasm of other organs and systems: Secondary | ICD-10-CM

## 2022-07-15 DIAGNOSIS — R42 Dizziness and giddiness: Secondary | ICD-10-CM

## 2022-07-15 LAB — BASIC METABOLIC PANEL
BUN: 15 mg/dL (ref 6–23)
CO2: 29 mEq/L (ref 19–32)
Calcium: 9.9 mg/dL (ref 8.4–10.5)
Chloride: 101 mEq/L (ref 96–112)
Creatinine, Ser: 0.72 mg/dL (ref 0.40–1.20)
GFR: 78.02 mL/min (ref >60.00)
Glucose, Bld: 99 mg/dL (ref 70–99)
Potassium: 4.9 mEq/L (ref 3.5–5.1)
Sodium: 139 mEq/L (ref 135–145)

## 2022-07-15 LAB — LIPID PANEL
Cholesterol: 213 mg/dL — ABNORMAL HIGH (ref 0–200)
HDL: 78.2 mg/dL (ref >39.00)
LDL Cholesterol: 113 mg/dL — ABNORMAL HIGH (ref 0–99)
NonHDL: 134.77
Total CHOL/HDL Ratio: 3
Triglycerides: 108 mg/dL (ref 0.0–149.0)
VLDL: 21.6 mg/dL (ref 0.0–40.0)

## 2022-07-15 LAB — HEPATIC FUNCTION PANEL
ALT: 17 U/L (ref 0–35)
AST: 23 U/L (ref 0–37)
Albumin: 4.4 g/dL (ref 3.5–5.2)
Alkaline Phosphatase: 68 U/L (ref 39–117)
Bilirubin, Direct: 0.1 mg/dL (ref 0.0–0.3)
Total Bilirubin: 0.8 mg/dL (ref 0.2–1.2)
Total Protein: 6.9 g/dL (ref 6.0–8.3)

## 2022-07-15 LAB — TSH: TSH: 0.45 u[IU]/mL (ref 0.35–5.50)

## 2022-07-15 LAB — CBC WITH DIFFERENTIAL/PLATELET
Basophils Absolute: 0 10*3/uL (ref 0.0–0.1)
Basophils Relative: 0.5 % (ref 0.0–3.0)
Eosinophils Absolute: 0.1 10*3/uL (ref 0.0–0.7)
Eosinophils Relative: 2.4 % (ref 0.0–5.0)
HCT: 41.7 % (ref 36.0–46.0)
Hemoglobin: 14.2 g/dL (ref 12.0–15.0)
Lymphocytes Relative: 28 % (ref 12.0–46.0)
Lymphs Abs: 1.4 10*3/uL (ref 0.7–4.0)
MCHC: 34 g/dL (ref 30.0–36.0)
MCV: 89.9 fl (ref 78.0–100.0)
Monocytes Absolute: 0.3 10*3/uL (ref 0.1–1.0)
Monocytes Relative: 6.7 % (ref 3.0–12.0)
Neutro Abs: 3 10*3/uL (ref 1.4–7.7)
Neutrophils Relative %: 62.4 % (ref 43.0–77.0)
Platelets: 175 10*3/uL (ref 150.0–400.0)
RBC: 4.64 Mil/uL (ref 3.87–5.11)
RDW: 12.7 % (ref 11.5–15.5)
WBC: 4.9 10*3/uL (ref 4.0–10.5)

## 2022-07-15 LAB — SEDIMENTATION RATE: Sed Rate: 13 mm/hr (ref 0–30)

## 2022-07-15 MED ORDER — MUPIROCIN 2 % EX OINT: 1.0000 | TOPICAL_OINTMENT | Freq: Two times a day (BID) | CUTANEOUS | 0 refills | Status: AC

## 2022-07-15 NOTE — Telephone Encounter (Signed)
Noted will order labs

## 2022-07-15 NOTE — Assessment & Plan Note (Signed)
Physical today 07/15/22.  Mammogram as outlined.  Need copy of last colonoscopy.

## 2022-07-15 NOTE — Telephone Encounter (Signed)
At checkout, patient states she has her physical scheduled for 07/21/2023, but she does not have her labs scheduled for this appointment and usually she does.  I went ahead and scheduled her lab appointment prior to the visit per patient request.  I do not see a lab order in the system yet for this but Dr. Einar Pheasant usually likes to have lab results when she meets with patient.

## 2022-07-15 NOTE — Progress Notes (Signed)
Subjective:    Patient ID: Kayla Navarro, female    DOB: 1940/07/02, 82 y.o.   MRN: [id number]  Patient here for  Chief Complaint  Patient presents with   Annual Exam    HPI With past history of hypercholesterolemia, hypothyroidism and hypertension.  She comes in today to follow up on these issues as well as for a complete physical exam. Saw Dr Bary Castilla 04/09/22 - f/u abnormal mammogram.  Recommended patient to return in 1 year with a unilateral right diagnostic mammogram with Dr. Peyton Najjar. Discussed with her.  Discussed radiology recommendation for 6 month f/u.  She declines.  Stays active.  No chest pain or sob reported.  Reports had squamous cell removed from left lower leg.  Seeing dermatology.  Has lesion - left anterior leg.  Has appt with dermatology.  Discussed using bactroban until evaluation.  Has a history of inner ear.  Noticed recently - unsteadiness.  Also dizziness/inner ear issues.  Persistent.  Discussed reevaluation by ENT.  No increased heart rate or palpitations.  No abdominal pain or bowel change.   Need copy of last colonoscopy.    Past Medical History:  Diagnosis Date   Degenerative disc disease, cervical    GERD (gastroesophageal reflux disease)    History of colon polyps    History of hypertension    Hypercholesteremia    Hypothyroid    Past Surgical History:  Procedure Laterality Date   AUGMENTATION MAMMAPLASTY Left 2009   transflap done on mastectomy    BREAST BIOPSY Right    1990's neg   CATARACT EXTRACTION Right 2015   MASTECTOMY  2002   ROTATOR CUFF REPAIR     TONSILLECTOMY AND ADENOIDECTOMY     TRIGGER FINGER RELEASE     Family History  Problem Relation Age of Onset   Stomach cancer Mother    Hypertension Mother    CAD Mother    Hypercholesterolemia Mother    Parkinson's disease Father    Heart disease Father    High Cholesterol Father    Breast cancer Maternal Aunt    Parkinson's disease Paternal Grandmother    Heart disease Brother     Social History   Socioeconomic History   Marital status: Married    Spouse name: Not on file   Number of children: Not on file   Years of education: Not on file   Highest education level: Not on file  Occupational History   Not on file  Tobacco Use   Smoking status: Never   Smokeless tobacco: Never  Substance and Sexual Activity   Alcohol use: Yes    Comment: occasional   Drug use: Never   Sexual activity: Not Currently  Other Topics Concern   Not on file  Social History Narrative   Not on file   Social Determinants of Health   Financial Resource Strain: Low Risk  (07/16/2021)   Overall Financial Resource Strain (CARDIA)    Difficulty of Paying Living Expenses: Not hard at all  Food Insecurity: No Food Insecurity (07/16/2021)   Hunger Vital Sign    Worried About Running Out of Food in the Last Year: Never true    Ran Out of Food in the Last Year: Never true  Transportation Needs: No Transportation Needs (07/16/2021)   PRAPARE - Hydrologist (Medical): No    Lack of Transportation (Non-Medical): No  Physical Activity: Not on file  Stress: No Stress Concern Present (07/16/2021)   [employer]  Occupational [employer] - Occupational Stress Questionnaire    Feeling of Stress : Not at all  Social Connections: Unknown (07/16/2021)   Social Connection and Isolation Panel [NHANES]    Frequency of Communication with Friends and Family: More than three times a week    Frequency of Social Gatherings with Friends and Family: More than three times a week    Attends Religious Services: Not on Advertising copywriter or Organizations: Not on file    Attends Archivist Meetings: More than 4 times per year    Marital Status: Married     Review of Systems  Constitutional:  Negative for appetite change and unexpected weight change.  HENT:  Negative for congestion and sinus pressure.   Respiratory:  Negative for cough, chest tightness  and shortness of breath.   Cardiovascular:  Negative for chest pain and palpitations.  Gastrointestinal:  Negative for abdominal pain, diarrhea, nausea and vomiting.  Genitourinary:  Negative for difficulty urinating and dysuria.  Musculoskeletal:  Negative for joint swelling and myalgias.  Skin:  Negative for color change and rash.  Neurological:  Positive for dizziness.       Some occasional left side headache.  Not constant. No increased headache.   Psychiatric/Behavioral:  Negative for agitation and dysphoric mood.        Objective:     BP 136/72   Pulse 76   Temp 98.2 F (36.8 C)   Resp 16   Ht 5' 1.5" (1.562 m)   Wt 127 lb 6.4 oz (57.8 kg)   SpO2 98%   BMI 23.68 kg/m  Wt Readings from Last 3 Encounters:  07/15/22 127 lb 6.4 oz (57.8 kg)  07/16/21 127 lb (57.6 kg)  06/21/21 127 lb (57.6 kg)    Physical Exam Vitals reviewed.  Constitutional:      General: She is not in acute distress.    Appearance: Normal appearance. She is well-developed.  HENT:     Head: Normocephalic and atraumatic.     Right Ear: External ear normal.     Left Ear: External ear normal.  Eyes:     General: No scleral icterus.       Right eye: No discharge.        Left eye: No discharge.     Conjunctiva/sclera: Conjunctivae normal.  Neck:     Thyroid: No thyromegaly.  Cardiovascular:     Rate and Rhythm: Normal rate and regular rhythm.  Pulmonary:     Effort: No tachypnea, accessory muscle usage or respiratory distress.     Breath sounds: Normal breath sounds. No decreased breath sounds or wheezing.  Chest:  Breasts:    Right: No inverted nipple, mass, nipple discharge or tenderness (no axillary adenopathy).     Left: No inverted nipple, mass, nipple discharge or tenderness (no axilarry adenopathy).  Abdominal:     General: Bowel sounds are normal.     Palpations: Abdomen is soft.     Tenderness: There is no abdominal tenderness.  Musculoskeletal:        General: No swelling or  tenderness.     Cervical back: Neck supple. No tenderness.  Lymphadenopathy:     Cervical: No cervical adenopathy.  Skin:    Findings: No erythema or rash.  Neurological:     Mental Status: She is alert and oriented to person, place, and time.  Psychiatric:        Mood and Affect: Mood normal.  Behavior: Behavior normal.      Outpatient Encounter Medications as of 07/15/2022  Medication Sig   mupirocin ointment (BACTROBAN) 2 % Apply 1 Application topically 2 (two) times daily.   Biotin 10 MG TABS Take by mouth.   Calcium Carbonate Antacid (CALCIUM CARBONATE, DOSED IN MG ELEMENTAL CALCIUM,) 1250 MG/5ML SUSP Take by mouth.   Cholecalciferol (VITAMIN D3) 10 MCG (400 UNIT) CAPS Take by mouth.   levothyroxine (SYNTHROID) 75 MCG tablet Take 1 tablet (75 mcg total) by mouth daily.   Red Yeast Rice Extract 600 MG CAPS Take 600 mg by mouth daily.   Zinc Sulfate (ZINC 15 PO) Take by mouth.   zolpidem (AMBIEN) 10 MG tablet Take 10 mg by mouth at bedtime as needed.   No facility-administered encounter medications on file as of 07/15/2022.     Lab Results  Component Value Date   WBC 4.9 07/15/2022   HGB 14.2 07/15/2022   HCT 41.7 07/15/2022   PLT 175.0 07/15/2022   GLUCOSE 99 07/15/2022   CHOL 213 (H) 07/15/2022   TRIG 108.0 07/15/2022   HDL 78.20 07/15/2022   LDLCALC 113 (H) 07/15/2022   ALT 17 07/15/2022   AST 23 07/15/2022   NA 139 07/15/2022   K 4.9 07/15/2022   CL 101 07/15/2022   CREATININE 0.72 07/15/2022   BUN 15 07/15/2022   CO2 29 07/15/2022   TSH 0.45 07/15/2022    MM DIAG BREAST TOMO UNI RIGHT  Result Date: 04/01/2022 CLINICAL DATA:  Follow-up for RIGHT breast distortion. The RIGHT breast distortion was initially identified on screening mammogram dated 03/07/2020. To this point, patient and ordering physician have declined stereotactic biopsy in favor of mammographic surveillance. History of LEFT breast cancer in 2002 status post mastectomy with tram flap  reconstruction. EXAM: DIGITAL DIAGNOSTIC UNILATERAL RIGHT MAMMOGRAM WITH TOMOSYNTHESIS TECHNIQUE: Right digital diagnostic mammography and breast tomosynthesis was performed. COMPARISON:  Previous exams including most recent bilateral diagnostic mammogram dated 03/30/2021. ACR Breast Density Category c: The breast tissue is heterogeneously dense, which may obscure small masses. FINDINGS: There is a persistent possible subtle architectural distortion within the retroareolar RIGHT breast, MLO view only, slice 49. There are no new dominant masses, suspicious calcifications or secondary signs of malignancy elsewhere within the RIGHT breast. IMPRESSION: Persistent possible subtle architectural distortion within the retroareolar RIGHT breast. Although not significantly changed compared to recent mammograms, stereotactic biopsy is still recommended to exclude low-grade neoplastic process. RECOMMENDATION: 1. Stereotactic biopsy for the possible subtle architectural distortion within the retroareolar RIGHT breast. Although the possible subtle distortion is not significantly changed compared to recent exams, stereotactic biopsy is still recommended to exclude low-grade neoplastic process. 2. If patient continues to elect for imaging surveillance in lieu of biopsy, recommend follow-up RIGHT breast diagnostic mammogram in 6 months to ensure continued mammographic stability. I have discussed the findings and recommendations with the patient. If applicable, a reminder letter will be sent to the patient regarding the next appointment. BI-RADS CATEGORY  4: Suspicious. Electronically Signed   By: Franki Cabot M.D.   On: 04/01/2022 10:19      Assessment & Plan:  Hypercholesteremia Assessment & Plan: Low cholesterol diet and exercise.  Follow lipid panel.  Check lipid panel today.   Orders: -     CBC with Differential/Platelet -     Basic metabolic panel -     Hepatic function panel -     Lipid panel  Healthcare  maintenance Assessment & Plan: Physical today 07/15/22.  Mammogram  as outlined.  Need copy of last colonoscopy.     Hypothyroidism, unspecified type Assessment & Plan: On thyroid replacement.  Follow tsh.   Orders: -     TSH  Nonintractable headache, unspecified chronicity pattern, unspecified headache type Assessment & Plan: Reported noticing some left side headache.  No severe pain.  Not constant.  Discussed allergies, pressure change, etc.  Discussed further w/up and evaluation.  Check routine labs.  Check esr.  Eyes checked.  Confirm no neck issues. Follow.  Call with update.    Orders: -     Sedimentation rate  History of breast cancer Assessment & Plan: Saw Dr Bary Castilla 04/09/22 - f/u abnormal mammogram.  Recommended patient to return in 1 year with a unilateral right diagnostic mammogram with Dr. Peyton Najjar. Discussed with her.  Discussed radiology recommendation for 6 month f/u.  She declines.    History of squamous cell carcinoma Assessment & Plan: Followed by dermatology.   With another raised skin lesion - leg.  Has appt with dermatology this week.  Bactroban topically until can be evaluated.     Dizziness Assessment & Plan: Has a history of vertigo.  Appears to have two separate concerns currently.  Discussed vertigo.  Discussed ENT evaluation and Epley maneuvers.  Also with unsteadiness.  Exercises.  Teaches tai chi.  Discussed further evaluation.  Wants to monitor.  Agreeable to referral to ENT.   Orders: -     Ambulatory referral to ENT  Elevated blood pressure reading Assessment & Plan: Blood pressure recheck 130/72.  Follow.    Other orders -     Mupirocin; Apply 1 Application topically 2 (two) times daily.  Dispense: 22 g; Refill: 0     Einar Pheasant, MD

## 2022-07-16 NOTE — Telephone Encounter (Signed)
Future labs for physical ordered.

## 2022-07-18 ENCOUNTER — Telehealth: Payer: Self-pay

## 2022-07-18 NOTE — Telephone Encounter (Signed)
LMTCB

## 2022-07-18 NOTE — Telephone Encounter (Signed)
-----   Message from Einar Pheasant, MD sent at 07/16/2022  4:55 AM EDT ----- Notify cholesterol levels are relatively stable when compared to previous check.  Given calculated cholesterol risk, it is recommended for her to be on a cholesterol medication.  If agreeable, would like to start crestor 10mg  q day.  Will need liver panel checked 6 weeks after starting.  If declines to start, continue diet and exercise.  Hgb, kidney function tests, thyroid test, inflammation test and liver function tests are all wnl.

## 2022-07-21 ENCOUNTER — Encounter: Payer: Self-pay | Admitting: Internal Medicine

## 2022-07-21 DIAGNOSIS — R42 Dizziness and giddiness: Secondary | ICD-10-CM | POA: Insufficient documentation

## 2022-07-21 NOTE — Assessment & Plan Note (Signed)
Followed by dermatology.   With another raised skin lesion - leg.  Has appt with dermatology this week.  Bactroban topically until can be evaluated.

## 2022-07-21 NOTE — Assessment & Plan Note (Signed)
Blood pressure recheck 130/72.  Follow.

## 2022-07-21 NOTE — Assessment & Plan Note (Signed)
Low cholesterol diet and exercise.  Follow lipid panel  Check lipid panel today.  

## 2022-07-21 NOTE — Assessment & Plan Note (Signed)
Saw Dr Bary Castilla 04/09/22 - f/u abnormal mammogram.  Recommended patient to return in 1 year with a unilateral right diagnostic mammogram with Dr. Peyton Najjar. Discussed with her.  Discussed radiology recommendation for 6 month f/u.  She declines.

## 2022-07-21 NOTE — Assessment & Plan Note (Addendum)
Has a history of vertigo.  Appears to have two separate concerns currently.  Discussed vertigo.  Discussed ENT evaluation and Epley maneuvers.  Also with unsteadiness.  Exercises.  Teaches tai chi.  Discussed further evaluation.  Wants to monitor.  Agreeable to referral to ENT.

## 2022-07-21 NOTE — Assessment & Plan Note (Signed)
On thyroid replacement.  Follow tsh.  

## 2022-07-21 NOTE — Assessment & Plan Note (Signed)
Reported noticing some left side headache.  No severe pain.  Not constant.  Discussed allergies, pressure change, etc.  Discussed further w/up and evaluation.  Check routine labs.  Check esr.  Eyes checked.  Confirm no neck issues. Follow.  Call with update.

## 2022-10-15 ENCOUNTER — Ambulatory Visit: Payer: Medicare Other | Admitting: Internal Medicine

## 2022-11-11 ENCOUNTER — Other Ambulatory Visit: Payer: Self-pay | Admitting: Internal Medicine

## 2023-01-13 ENCOUNTER — Telehealth: Payer: Self-pay

## 2023-01-13 NOTE — Telephone Encounter (Signed)
If she is having increased problems with sleep and feels like needs prescription medication, can schedule an appt to discuss.

## 2023-01-13 NOTE — Telephone Encounter (Signed)
Patient states she has seen Dr. Dale Herrings for her sleep issue.  Patient states she has become aware of a new sleep aid, Daridorexant Lennox Solders), and would like to know if Dr. Dale [place] would be willing to let her try it.  Please call.

## 2023-01-14 NOTE — Telephone Encounter (Signed)
Pt scheduled

## 2023-01-15 ENCOUNTER — Encounter: Payer: Self-pay | Admitting: Internal Medicine

## 2023-01-15 ENCOUNTER — Telehealth (INDEPENDENT_AMBULATORY_CARE_PROVIDER_SITE_OTHER): Payer: Medicare Other | Admitting: Internal Medicine

## 2023-01-15 DIAGNOSIS — R232 Flushing: Secondary | ICD-10-CM | POA: Diagnosis not present

## 2023-01-15 DIAGNOSIS — G479 Sleep disorder, unspecified: Secondary | ICD-10-CM | POA: Diagnosis not present

## 2023-01-15 DIAGNOSIS — R42 Dizziness and giddiness: Secondary | ICD-10-CM

## 2023-01-15 NOTE — Progress Notes (Signed)
Patient ID: Kayla Navarro, female   DOB: 04/05/1941, 82 y.o.   MRN: [id number]   Virtual Visit via video Note  I connected with Kayla Navarro by a video enabled telemedicine application and verified that I am speaking with the correct person using two identifiers. Location patient: home Location provider: work Persons participating in the virtual visit: patient, provider  The limitations, risks, security and privacy concerns of performing an evaluation and management service by video and the availability of in person appointments have been discussed.  It has also been discussed with the patient that there may be a patient responsible charge related to this service. The patient expressed understanding and agreed to proceed.   Reason for visit: work in appt  HPI: Work in appt to discuss sleep issues. She has had issues with sleep since treatment of breast cancer.  Reports feeling heat. Has what she describes as brain drain - when lying down.  Occasionally electrical tingling sensation in her legs.  Hard to fall asleep and stay asleep.  May only sleep for a couple of hours.  Has tried relaxation techniques. Also has tried multiple medications - belsomra, effexor, gabapentin.  Has tried acupuncture and tai chi.  Request to try quiviviq.     ROS: See pertinent positives and negatives per HPI.  Past Medical History:  Diagnosis Date   Degenerative disc disease, cervical    GERD (gastroesophageal reflux disease)    History of colon polyps    History of hypertension    Hypercholesteremia    Hypothyroid     Past Surgical History:  Procedure Laterality Date   AUGMENTATION MAMMAPLASTY Left 2009   transflap done on mastectomy    BREAST BIOPSY Right    1990's neg   CATARACT EXTRACTION Right 2015   MASTECTOMY  2002   ROTATOR CUFF REPAIR     TONSILLECTOMY AND ADENOIDECTOMY     TRIGGER FINGER RELEASE      Family History  Problem Relation Age of Onset   Stomach cancer Mother     Hypertension Mother    CAD Mother    Hypercholesterolemia Mother    Parkinson's disease Father    Heart disease Father    High Cholesterol Father    Breast cancer Maternal Aunt    Parkinson's disease Paternal Grandmother    Heart disease Brother     SOCIAL HX: reviewed.    Current Outpatient Medications:    Biotin 10 MG TABS, Take by mouth., Disp: , Rfl:    Calcium Carbonate Antacid (CALCIUM CARBONATE, DOSED IN MG ELEMENTAL CALCIUM,) 1250 MG/5ML SUSP, Take by mouth., Disp: , Rfl:    Cholecalciferol (VITAMIN D3) 10 MCG (400 UNIT) CAPS, Take by mouth., Disp: , Rfl:    levothyroxine (SYNTHROID) 75 MCG tablet, TAKE 1 TABLET DAILY, Disp: 90 tablet, Rfl: 3   mupirocin ointment (BACTROBAN) 2 %, Apply 1 Application topically 2 (two) times daily., Disp: 22 g, Rfl: 0   Red Yeast Rice Extract 600 MG CAPS, Take 600 mg by mouth daily., Disp: , Rfl:    Zinc Sulfate (ZINC 15 PO), Take by mouth., Disp: , Rfl:    zolpidem (AMBIEN) 10 MG tablet, Take 10 mg by mouth at bedtime as needed., Disp: , Rfl:   EXAM:  GENERAL: alert, oriented, appears well and in no acute distress  HEENT: atraumatic, conjunttiva clear, no obvious abnormalities on inspection of external nose and ears  NECK: normal movements of the head and neck  LUNGS: on inspection no signs of respiratory  distress, breathing rate appears normal, no obvious gross SOB, gasping or wheezing  CV: no obvious cyanosis  PSYCH/NEURO: pleasant and cooperative, no obvious depression or anxiety, speech and thought processing grossly intact  ASSESSMENT AND PLAN:  Discussed the following assessment and plan:  Problem List Items Addressed This Visit     Sleep difficulties - Primary    Discussed her sleep issues.  She has had issues with sleep since treatment of breast cancer.  Reports feeling heat. Has what she describes as brain drain - when lying down.  Occasionally electrical tingling sensation in her legs.  Hard to fall asleep and stay  asleep.  May only sleep for a couple of hours.  Has tried relaxation techniques. Also has tried multiple medications - belsomra, effexor, gabapentin.  Has tried acupuncture and tai chi.  Request to try quiviviq. Discussed medications.        Hot flashes    Previously on femara.  Initially thought symptoms related to femara. Has been off femara.  Has continued to have variety of symptoms as outlined.  Has tried gabapentin and effexor.  Request trial of quiviviq.        Dizziness    Has a history of vertigo. Epley maneuvers work.  Follow.        Return if symptoms worsen or fail to improve.   I discussed the assessment and treatment plan with the patient. The patient was provided an opportunity to ask questions and all were answered. The patient agreed with the plan and demonstrated an understanding of the instructions.   The patient was advised to call back or seek an in-person evaluation if the symptoms worsen or if the condition fails to improve as anticipated.    Dale [place], MD

## 2023-01-19 ENCOUNTER — Encounter: Payer: Self-pay | Admitting: Internal Medicine

## 2023-01-19 DIAGNOSIS — G479 Sleep disorder, unspecified: Secondary | ICD-10-CM | POA: Insufficient documentation

## 2023-01-19 NOTE — Assessment & Plan Note (Signed)
Previously on femara.  Initially thought symptoms related to femara. Has been off femara.  Has continued to have variety of symptoms as outlined.  Has tried gabapentin and effexor.  Request trial of quiviviq.

## 2023-01-19 NOTE — Assessment & Plan Note (Signed)
Discussed her sleep issues.  She has had issues with sleep since treatment of breast cancer.  Reports feeling heat. Has what she describes as brain drain - when lying down.  Occasionally electrical tingling sensation in her legs.  Hard to fall asleep and stay asleep.  May only sleep for a couple of hours.  Has tried relaxation techniques. Also has tried multiple medications - belsomra, effexor, gabapentin.  Has tried acupuncture and tai chi.  Request to try quiviviq. Discussed medications.

## 2023-01-19 NOTE — Assessment & Plan Note (Signed)
Has a history of vertigo. Epley maneuvers work.  Follow.

## 2023-01-20 ENCOUNTER — Telehealth: Payer: Self-pay | Admitting: Internal Medicine

## 2023-01-20 NOTE — Telephone Encounter (Signed)
Copied from CRM [id number]. Topic: Medicare AWV >> Jan 20, 2023 11:23 AM Payton Doughty wrote: Reason for CRM: LM 01/20/2023 to schedule AWV   Verlee Rossetti; Care Guide Ambulatory Clinical Support [place] [facility] Direct Dial: [phone]

## 2023-01-21 MED ORDER — QUVIVIQ 25 MG PO TABS: 25.0000 mg | ORAL_TABLET | Freq: Every day | ORAL | 1 refills | Status: DC

## 2023-01-21 NOTE — Telephone Encounter (Signed)
The patient called stating she has not received any medication to help her sleep.

## 2023-01-21 NOTE — Telephone Encounter (Signed)
lit search though it does not look like there is much out there regarding hot flashes specifically., moreso has been studied for general insomnia. It does look like it is the same drug class as her previous medication, Belsomra though I was not sure if she just found that medication ineffective. Certainly not unreasonable to try if she was able to get the insurance to pay for Belsomra. It also looks like the Neurokinin 3 receptor antagonists are increasing in favorability as a non-hormonal option for hot flashes though similar to Quviviq, I have not frequently encountered them. Looks like the main concern with those is regular monitoring of LFTs.  Discussed with Kayla Navarro.  Rx for [phone] sent in.  Pt aware.

## 2023-01-24 ENCOUNTER — Ambulatory Visit (INDEPENDENT_AMBULATORY_CARE_PROVIDER_SITE_OTHER): Payer: Medicare Other | Admitting: *Deleted

## 2023-01-24 VITALS — Ht 61.5 in | Wt 127.0 lb

## 2023-01-24 DIAGNOSIS — Z780 Asymptomatic menopausal state: Secondary | ICD-10-CM

## 2023-01-24 DIAGNOSIS — Z0000 Encounter for general adult medical examination without abnormal findings: Secondary | ICD-10-CM

## 2023-01-24 NOTE — Progress Notes (Signed)
Subjective:   Kayla Navarro is a 82 y.o. female who presents for Medicare Annual (Subsequent) preventive examination.  Visit Complete: Virtual  I connected with  Kayla Navarro on 01/24/23 by a audio enabled telemedicine application and verified that I am speaking with the correct person using two identifiers.  Patient Location: Home  Provider Location: Home Office  I discussed the limitations of evaluation and management by telemedicine. The patient expressed understanding and agreed to proceed.  Because this visit was a virtual/telehealth visit, some criteria may be missing or patient reported. Any vitals not documented were not able to be obtained and vitals that have been documented are patient reported.     Cardiac Risk Factors include: advanced age (>9men, >51 women);dyslipidemia     Objective:    Today's Vitals   01/24/23 0810  Weight: 127 lb (57.6 kg)  Height: 5' 1.5" (1.562 m)   Body mass index is 23.61 kg/m.     01/24/2023    8:26 AM 07/16/2021   10:25 AM 06/29/2020    2:22 PM  Advanced Directives  Does Patient Have a Medical Advance Directive? Yes Yes Yes  Type of Estate agent of [place];Living will Healthcare Power of [place];Living will Healthcare Power of [place];Living will  Does patient want to make changes to medical advance directive? No - Patient declined No - Patient declined No - Patient declined  Copy of Healthcare Power of Attorney in Chart? Yes - validated most recent copy scanned in chart (See row information) No - copy requested No - copy requested    Current Medications (verified) Outpatient Encounter Medications as of 01/24/2023  Medication Sig   Biotin 10 MG TABS Take by mouth.   Calcium Carbonate Antacid (CALCIUM CARBONATE, DOSED IN MG ELEMENTAL CALCIUM,) 1250 MG/5ML SUSP Take by mouth.   Cholecalciferol (VITAMIN D3) 10 MCG (400 UNIT) CAPS Take by mouth.   Daridorexant HCl (QUVIVIQ) 25 MG TABS Take 1 tablet (25 mg  total) by mouth daily.   levothyroxine (SYNTHROID) 75 MCG tablet TAKE 1 TABLET DAILY   mupirocin ointment (BACTROBAN) 2 % Apply 1 Application topically 2 (two) times daily.   Red Yeast Rice Extract 600 MG CAPS Take 600 mg by mouth daily.   Zinc Sulfate (ZINC 15 PO) Take by mouth. (Patient not taking: Reported on 01/24/2023)   No facility-administered encounter medications on file as of 01/24/2023.    Allergies (verified) Clindamycin, Sulfa antibiotics, Codeine, and Tramadol   History: Past Medical History:  Diagnosis Date   Degenerative disc disease, cervical    GERD (gastroesophageal reflux disease)    History of colon polyps    History of hypertension    Hypercholesteremia    Hypothyroid    Past Surgical History:  Procedure Laterality Date   AUGMENTATION MAMMAPLASTY Left 2009   transflap done on mastectomy    BREAST BIOPSY Right    1990's neg   CATARACT EXTRACTION Right 2015   MASTECTOMY  2002   ROTATOR CUFF REPAIR     TONSILLECTOMY AND ADENOIDECTOMY     TRIGGER FINGER RELEASE     Family History  Problem Relation Age of Onset   Stomach cancer Mother    Hypertension Mother    CAD Mother    Hypercholesterolemia Mother    Parkinson's disease Father    Heart disease Father    High Cholesterol Father    Breast cancer Maternal Aunt    Parkinson's disease Paternal Grandmother    Heart disease Brother    Social  History   Socioeconomic History   Marital status: Married    Spouse name: Not on file   Number of children: Not on file   Years of education: Not on file   Highest education level: Not on file  Occupational History   Not on file  Tobacco Use   Smoking status: Never   Smokeless tobacco: Never  Substance and Sexual Activity   Alcohol use: Yes    Comment: occasional   Drug use: Never   Sexual activity: Not Currently  Other Topics Concern   Not on file  Social History Narrative   Married   Social Determinants of Health   Financial Resource Strain:  Low Risk  (01/24/2023)   Overall Financial Resource Strain (CARDIA)    Difficulty of Paying Living Expenses: Not hard at all  Food Insecurity: No Food Insecurity (01/24/2023)   Hunger Vital Sign    Worried About Running Out of Food in the Last Year: Never true    Ran Out of Food in the Last Year: Never true  Transportation Needs: No Transportation Needs (01/24/2023)   PRAPARE - Administrator, Civil Service (Medical): No    Lack of Transportation (Non-Medical): No  Physical Activity: Sufficiently Active (01/24/2023)   Exercise Vital Sign    Days of Exercise per Week: 4 days    Minutes of Exercise per Session: 40 min  Stress: No Stress Concern Present (01/24/2023)   [employer] of Occupational Health - Occupational Stress Questionnaire    Feeling of Stress : Only a little  Social Connections: Socially Integrated (01/24/2023)   Social Connection and Isolation Panel [NHANES]    Frequency of Communication with Friends and Family: More than three times a week    Frequency of Social Gatherings with Friends and Family: More than three times a week    Attends Religious Services: More than 4 times per year    Active Member of [employer] or Organizations: Yes    Attends Engineer, structural: More than 4 times per year    Marital Status: Married    Tobacco Counseling Counseling given: Not Answered   Clinical Intake:  Pre-visit preparation completed: Yes  Pain : No/denies pain     BMI - recorded: 23.61 Nutritional Status: BMI of 19-24  Normal Nutritional Risks: None Diabetes: No  How often do you need to have someone help you when you read instructions, pamphlets, or other written materials from your doctor or pharmacy?: 1 - Never  Interpreter Needed?: No  Information entered by :: R. Fredrica Capano LPN   Activities of Daily Living    01/24/2023    8:12 AM  In your present state of health, do you have any difficulty performing the following activities:  Hearing? 0   Vision? 0  Difficulty concentrating or making decisions? 0  Walking or climbing stairs? 0  Dressing or bathing? 0  Doing errands, shopping? 0  Preparing Food and eating ? N  Using the Toilet? N  In the past six months, have you accidently leaked urine? N  Do you have problems with loss of bowel control? N  Managing your Medications? N  Managing your Finances? N  Housekeeping or managing your Housekeeping? N    Patient Care Team: Dale Cabo Rojo, MD as PCP - General (Internal Medicine)  Indicate any recent Medical Services you may have received from other than [facility] providers in the past year (date may be approximate).     Assessment:   This is a  routine wellness examination for Kayla Navarro.  Hearing/Vision screen Hearing Screening - Comments:: No issues Vision Screening - Comments:: History of cataract surgery   Goals Addressed             This Visit's Progress    Patient Stated       Wants to keep active and travel       Depression Screen    01/24/2023    8:20 AM 01/15/2023    7:23 AM 07/16/2021   10:24 AM 12/01/2020    8:36 AM 06/29/2020    2:19 PM 02/14/2020   10:44 AM  PHQ 2/9 Scores  PHQ - 2 Score 0 0 0 0 0 0  PHQ- 9 Score 1         Fall Risk    01/24/2023    8:14 AM 07/16/2021   10:26 AM 12/01/2020    8:36 AM 06/29/2020    2:22 PM 11/05/2019   11:39 AM  Fall Risk   Falls in the past year? 0 0 1 0 0  Number falls in past yr: 0 0 1 0   Injury with Fall? 0  0 0   Risk for fall due to : No Fall Risks      Follow up Falls prevention discussed;Falls evaluation completed Falls evaluation completed Falls evaluation completed Falls evaluation completed Falls evaluation completed    MEDICARE RISK AT HOME: Medicare Risk at Home Any stairs in or around the home?: Yes If so, are there any without handrails?: No Home free of loose throw rugs in walkways, pet beds, electrical cords, etc?: Yes Adequate lighting in your home to reduce risk of falls?: Yes Life alert?:  Yes Use of a cane, walker or w/c?: No Grab bars in the bathroom?: Yes Shower chair or bench in shower?: Yes Elevated toilet seat or a handicapped toilet?: Yes   Cognitive Function:        01/24/2023    8:27 AM  6CIT Screen  What Year? 0 points  What month? 0 points  What time? 0 points  Count back from 20 0 points  Months in reverse 0 points  Repeat phrase 0 points  Total Score 0 points    Immunizations Immunization History  Administered Date(s) Administered   Fluad Quad(high Dose 65+) 04/05/2021   Hep A / Hep B 01/06/2008   Influenza, High Dose Seasonal PF 04/05/2015, 02/01/2016, 02/17/2017, 03/31/2018, 02/07/2022   Influenza-Unspecified 01/28/2019   Moderna Sars-Covid-2 Vaccination 05/10/2018, 06/08/2019, 05/05/2020   Pneumococcal Conjugate-13 05/26/2018   Pneumococcal Polysaccharide-23 02/10/2012   Tdap 01/06/2008   Zoster, Live 01/05/2009    TDAP status: Due, Education has been provided regarding the importance of this vaccine. Advised may receive this vaccine at local pharmacy or Health Dept. Aware to provide a copy of the vaccination record if obtained from local pharmacy or Health Dept. Verbalized acceptance and understanding.  Flu Vaccine status: Due, Education has been provided regarding the importance of this vaccine. Advised may receive this vaccine at local pharmacy or Health Dept. Aware to provide a copy of the vaccination record if obtained from local pharmacy or Health Dept. Verbalized acceptance and understanding.  Pneumococcal vaccine status: Up to date  Covid-19 vaccine status: Information provided on how to obtain vaccines.   Qualifies for Shingles Vaccine? Yes   Zostavax completed Yes   Shingrix Completed?: No.    Education has been provided regarding the importance of this vaccine. Patient has been advised to call insurance company to determine out of pocket  expense if they have not yet received this vaccine. Advised may also receive vaccine at local  pharmacy or Health Dept. Verbalized acceptance and understanding.  Screening Tests Health Maintenance  Topic Date Due   DEXA SCAN  Never done   DTaP/Tdap/Td (2 - Td or Tdap) 01/05/2018   Medicare Annual Wellness (AWV)  07/17/2022   COVID-19 Vaccine (4 - 2023-24 season) 01/31/2023 (Originally 12/29/2022)   Zoster Vaccines- Shingrix (1 of 2) 04/16/2023 (Originally 05/28/1959)   INFLUENZA VACCINE  07/28/2023 (Originally 11/28/2022)   Pneumonia Vaccine 73+ Years old  Completed   HPV VACCINES  Aged Out    Health Maintenance  Health Maintenance Due  Topic Date Due   DEXA SCAN  Never done   DTaP/Tdap/Td (2 - Td or Tdap) 01/05/2018   Medicare Annual Wellness (AWV)  07/17/2022    Colorectal cancer screening: No longer required.   Mammogram status: Completed 03/2022. Repeat every year  Bone Density status: Ordered 01/24/23. Pt provided with contact info and advised to call to schedule appt.  Lung Cancer Screening: (Low Dose CT Chest recommended if Age 11-80 years, 20 pack-year currently smoking OR have quit w/in 15years.) does not qualify.     Additional Screening:  Hepatitis C Screening: does not qualify; Completed NA age  Vision Screening: Recommended annual ophthalmology exams for early detection of glaucoma and other disorders of the eye. Is the patient up to date with their annual eye exam?  Yes  Who is the provider or what is the name of the office in which the patient attends annual eye exams? [facility] If pt is not established with a provider, would they like to be referred to a provider to establish care? No .   Dental Screening: Recommended annual dental exams for proper oral hygiene   Community Resource Referral / Chronic Care Management: CRR required this visit?  No   CCM required this visit?  No     Plan:     I have personally reviewed and noted the following in the patient's chart:   Medical and social history Use of alcohol, tobacco or illicit drugs   Current medications and supplements including opioid prescriptions. Patient is not currently taking opioid prescriptions. Functional ability and status Nutritional status Physical activity Advanced directives List of other physicians Hospitalizations, surgeries, and ER visits in previous 12 months Vitals Screenings to include cognitive, depression, and falls Referrals and appointments  In addition, I have reviewed and discussed with patient certain preventive protocols, quality metrics, and best practice recommendations. A written personalized care plan for preventive services as well as general preventive health recommendations were provided to patient.     Sydell Axon, LPN   1/61/0960   After Visit Summary: (MyChart) Due to this being a telephonic visit, the after visit summary with patients personalized plan was offered to patient via MyChart   Nurse Notes: None

## 2023-01-24 NOTE — Patient Instructions (Addendum)
Kayla Navarro , Thank you for taking time to come for your Medicare Wellness Visit. I appreciate your ongoing commitment to your health goals. Please review the following plan we discussed and let me know if I can assist you in the future.   You have an order for:  []   2D Mammogram  []   3D Mammogram  [x]   Bone Density     Please call for appointment:  [facility]  [address] [place] [address] [phone]  Make sure to wear two-piece clothing.  No lotions, powders, or deodorants the day of the appointment. Make sure to bring picture ID and insurance card.  Bring list of medications you are currently taking including any supplements.   Schedule your [place] screening mammogram through MyChart!   Log into your MyChart account.  Go to 'Visit' (or 'Appointments' if on mobile App) --> Schedule an Appointment  Under 'Select a Reason for Visit' choose the Mammogram Screening option.  Complete the pre-visit questions and select the time and place that best fits your schedule.    Referrals/Orders/Follow-Ups/Clinician Recommendations: Dexa/Bone Density, Make sure you get your vaccines updated  This is a list of the screening recommended for you and due dates:  Health Maintenance  Topic Date Due   DEXA scan (bone density measurement)  Never done   DTaP/Tdap/Td vaccine (2 - Td or Tdap) 01/05/2018   COVID-19 Vaccine (4 - 2023-24 season) 01/31/2023*   Zoster (Shingles) Vaccine (1 of 2) 04/16/2023*   Flu Shot  07/28/2023*   Mammogram  04/02/2023   Medicare Annual Wellness Visit  01/24/2024   Pneumonia Vaccine  Completed   HPV Vaccine  Aged Out  *Topic was postponed. The date shown is not the original due date.    Advanced directives: (In Chart) A copy of your advanced directives are scanned into your chart should your provider ever need it.  Next Medicare Annual Wellness Visit scheduled for next year: Yes 10/1/

## 2023-02-17 ENCOUNTER — Telehealth: Payer: Self-pay | Admitting: Internal Medicine

## 2023-02-17 NOTE — Telephone Encounter (Signed)
Patient just returned phone call. Can you call her back when you get a chance. Her number is [phone].

## 2023-02-17 NOTE — Telephone Encounter (Signed)
Pt would like to be called regarding a sleeping medication that is not helping her

## 2023-02-17 NOTE — Telephone Encounter (Signed)
Called patient. Call was disconnected. 

## 2023-02-18 NOTE — Telephone Encounter (Signed)
Patient just called back and said can someone call her today after 10. Regarding her sleeping medication that is not helping her. Her number is [phone].

## 2023-02-19 NOTE — Telephone Encounter (Signed)
Noted.  Will await her f/u

## 2023-02-20 NOTE — Telephone Encounter (Signed)
Patient called and would like the higher dose of QUVIVIQ 50 mg. She does need it sent to  [address] not the one in [employer].

## 2023-02-21 MED ORDER — QUVIVIQ 50 MG PO TABS: 50.0000 mg | ORAL_TABLET | Freq: Every day | ORAL | 1 refills | Status: DC

## 2023-02-21 NOTE — Telephone Encounter (Signed)
See attached. Rx sent in.  50mg  dose of quiviviq - take one before bed.

## 2023-02-21 NOTE — Addendum Note (Signed)
Addended by: Charm Barges on: 02/21/2023 12:37 PM   Modules accepted: Orders

## 2023-02-21 NOTE — Telephone Encounter (Signed)
Rx sent in for quviviq 50mg  to take one before bed.  [facility] [address]

## 2023-02-21 NOTE — Telephone Encounter (Signed)
Patient is aware 

## 2023-02-21 NOTE — Telephone Encounter (Signed)
Are you ok with sending in increased dose of the Quviviq? Per note below she was doubling her 25 mg dose to see how she did on the 50 before sending in refill.

## 2023-02-27 ENCOUNTER — Other Ambulatory Visit: Payer: Self-pay | Admitting: General Surgery

## 2023-02-27 DIAGNOSIS — Z853 Personal history of malignant neoplasm of breast: Secondary | ICD-10-CM

## 2023-02-27 NOTE — Telephone Encounter (Signed)
She has tried multiple medications and requested this medication. Please clarify what is needed.

## 2023-02-27 NOTE — Telephone Encounter (Signed)
[facility] that Quviviq 50mg  is not covered by insurance. Requests alternative.

## 2023-02-28 NOTE — Telephone Encounter (Signed)
LMTCB

## 2023-03-04 NOTE — Telephone Encounter (Signed)
LMTCB. Need to know if she picked this medication up. (Was not sure if she paid for it without insurance)

## 2023-03-05 NOTE — Telephone Encounter (Signed)
Patient picked up prescription with good rx coupon.

## 2023-03-24 ENCOUNTER — Other Ambulatory Visit: Payer: Self-pay | Admitting: Internal Medicine

## 2023-04-04 ENCOUNTER — Ambulatory Visit
Admission: RE | Admit: 2023-04-04 | Discharge: 2023-04-04 | Disposition: A | Discharge status: Home / Self Care | Payer: Medicare Other | Source: Ambulatory Visit | Attending: General Surgery

## 2023-04-04 DIAGNOSIS — Z853 Personal history of malignant neoplasm of breast: Secondary | ICD-10-CM

## 2023-04-07 ENCOUNTER — Telehealth: Payer: Self-pay | Admitting: Internal Medicine

## 2023-04-07 NOTE — Telephone Encounter (Signed)
Prescription Request  04/07/2023  LOV: 07/15/2022  What is the name of the medication or equipment? QUVIVIQ 50 MG TABS   Have you contacted your pharmacy to request a refill? Yes   Which pharmacy would you like this sent to?    [id number] [address] [place] [address] Phone: [phone] Fax: [phone]     Patient notified that their request is being sent to the clinical staff for review and that they should receive a response within 2 business days.   Please advise at Mobile [phone] (mobile)   Pt states she's leaving to go out of town soon & requested for rx be called in before she leaves. Pt also requested for a call back once [facility]

## 2023-04-07 NOTE — Telephone Encounter (Signed)
Called pt to let her know that she should have refill at pharmacy

## 2023-04-08 ENCOUNTER — Telehealth: Payer: Self-pay

## 2023-04-08 NOTE — Telephone Encounter (Signed)
Ok for early refill of quiviq 50 mg since will be out of town at time refill is due?

## 2023-04-08 NOTE — Telephone Encounter (Signed)
Patient states she is leaving for [place] on 04/16/2023 and will not return until 04/24/2023, which is the day her medication is due to be refilled.  Patient states her pharmacy won't refill her prescription until 04/24/2023, which will put her one pill short.  Patient states her pharmacy told her that Dr. Lorin Picket can send in an early refill request in order for patient to have a pill for the night when she gets back.  Patient states she is not sure exactly what time they will return, and she may not be able to get to the pharmacy on the night she returns.  Patient states her pharmacy told her that they will need to have the request from Dr. Lorin Picket by 04/14/2023 in order for patient to be able to refill her prescription before she leaves on 04/16/2023, as they have to order the medication.  Patient states she doesn't care if we refill the whole thing or just a pill or two, so she won't have to skip a night.  Patient states she would like for Rita Ohara, LPN, to send her a message via MyChart to let her know when the request from Dr. Lorin Picket has been sent to the pharmacy.

## 2023-04-08 NOTE — Telephone Encounter (Signed)
ok 

## 2023-04-09 NOTE — Telephone Encounter (Signed)
Patient returned call from Rita Ohara, LPN.  I transferred call to [place].

## 2023-04-09 NOTE — Telephone Encounter (Signed)
Called patient to let her know that I called to let patient know that her discount card is still not authorizing refill even with [place] giving authorization. Rx would cost $585. Pharmacist advised that she may be able to fill it around the 17th and it go through. [facility]

## 2023-04-10 ENCOUNTER — Encounter: Payer: Self-pay | Admitting: Internal Medicine

## 2023-04-10 DIAGNOSIS — R42 Dizziness and giddiness: Secondary | ICD-10-CM

## 2023-04-14 NOTE — Telephone Encounter (Signed)
[facility] called checking on an order for an epley maneuver

## 2023-04-14 NOTE — Telephone Encounter (Signed)
Spoke with patient. She has had this therapy done in the past and has a history of vertigo. She would like to do the therapy at [facility]. Ok to place referral for her? Confirmed patient is doing ok she says no acute onset dizziness. No other acute symptoms. Pt states she has been dealing with this for years but was not aware until recently that they do vestibular therapy at [facility] where she lives.

## 2023-04-14 NOTE — Telephone Encounter (Signed)
Order placed for PT at [facility]. It appears she was wanting before 04/16/23.

## 2023-05-23 ENCOUNTER — Ambulatory Visit
Admission: RE | Admit: 2023-05-23 | Discharge: 2023-05-23 | Disposition: A | Discharge status: Home / Self Care | Payer: Medicare Other | Source: Ambulatory Visit | Attending: Internal Medicine | Admitting: Internal Medicine

## 2023-05-23 DIAGNOSIS — Z780 Asymptomatic menopausal state: Secondary | ICD-10-CM | POA: Insufficient documentation

## 2023-06-05 ENCOUNTER — Telehealth: Payer: Self-pay | Admitting: *Deleted

## 2023-06-05 NOTE — Telephone Encounter (Signed)
 Pt scheduled for virtual to discuss changing sleep medication

## 2023-06-05 NOTE — Telephone Encounter (Signed)
 Copied from CRM [id number]. Topic: Clinical - Medication Question >> Jun 05, 2023 10:36 AM Pinkey ORN wrote: Reason for CRM: QUVIVIQ  50 MG TABS >> Jun 05, 2023 10:37 AM Pinkey ORN wrote: Patient is requesting a call back to discuss some concerns with this medication. Call back number [phone]

## 2023-06-06 ENCOUNTER — Encounter: Payer: Self-pay | Admitting: Internal Medicine

## 2023-06-06 ENCOUNTER — Telehealth (INDEPENDENT_AMBULATORY_CARE_PROVIDER_SITE_OTHER): Payer: Medicare Other | Admitting: Internal Medicine

## 2023-06-06 VITALS — Ht 61.5 in | Wt 127.0 lb

## 2023-06-06 DIAGNOSIS — E039 Hypothyroidism, unspecified: Secondary | ICD-10-CM | POA: Diagnosis not present

## 2023-06-06 DIAGNOSIS — G479 Sleep disorder, unspecified: Secondary | ICD-10-CM

## 2023-06-06 DIAGNOSIS — E7800 Pure hypercholesterolemia, unspecified: Secondary | ICD-10-CM

## 2023-06-06 DIAGNOSIS — R42 Dizziness and giddiness: Secondary | ICD-10-CM | POA: Diagnosis not present

## 2023-06-06 NOTE — Progress Notes (Signed)
 Patient ID: Kayla Navarro, female   DOB: [birth year], 83 y.o.   MRN: [id number]   Virtual Visit via video Note  I connected with Ishani Folden by a video enabled telemedicine application and verified that I am speaking with the correct person using two identifiers. Location patient: home Location provider: work Persons participating in the virtual visit: patient, provider  The limitations, risks, security and privacy concerns of performing an evaluation and management service by video and the availability of in person appointments have been discussed. It has also been discussed with the patient that there may be a patient responsible charge related to this service. The patient expressed understanding and agreed to proceed.   Reason for visit: work in appt  HPI: Work in to discuss sleep issues and her current medication. She has had issues with sleep since treatment of breast cancer. Reports feeling heat. Has what she describes as brain drain - when lying down. Occasionally electrical tingling sensation in her legs. Hard to fall asleep and stay asleep. May only sleep for a couple of hours. Has tried relaxation techniques. Also has tried multiple medications - including effexor, gabapentin. Has tried acupuncture and tai chi. Was started on quiviviq last visit. Does take an occasional partial ambien. Has noticed recently more issue with vertigo. Has a history of vertigo. Has seen ENT previously. Epley maneuvers. Has been doing exercises - tai chi. Is concerned that the quiviviq is contributing to her symptoms. Reports approximately three weeks ago, she decreased her quiviviq to 1/2 tablet q hs. Request to have something more to help with sleep. Request dayvigo.    ROS: See pertinent positives and negatives per HPI.  Past Medical History:  Diagnosis Date   Degenerative disc disease, cervical    GERD (gastroesophageal reflux disease)    History of colon polyps    History of hypertension     Hypercholesteremia    Hypothyroid     Past Surgical History:  Procedure Laterality Date   AUGMENTATION MAMMAPLASTY Left 2009   transflap done on mastectomy    BREAST EXCISIONAL BIOPSY Right    1990's neg   CATARACT EXTRACTION Right 2015   MASTECTOMY  2002   ROTATOR CUFF REPAIR     TONSILLECTOMY AND ADENOIDECTOMY     TRIGGER FINGER RELEASE      Family History  Problem Relation Age of Onset   Stomach cancer Mother    Hypertension Mother    CAD Mother    Hypercholesterolemia Mother    Parkinson's disease Father    Heart disease Father    High Cholesterol Father    Breast cancer Maternal Aunt    Parkinson's disease Paternal Grandmother    Heart disease Brother     SOCIAL HX: reviewed.    Current Outpatient Medications:    Biotin 10 MG TABS, Take by mouth., Disp: , Rfl:    Calcium Carbonate Antacid (CALCIUM CARBONATE, DOSED IN MG ELEMENTAL CALCIUM,) 1250 MG/5ML SUSP, Take by mouth., Disp: , Rfl:    Cholecalciferol (VITAMIN D3) 10 MCG (400 UNIT) CAPS, Take by mouth., Disp: , Rfl:    levothyroxine  (SYNTHROID ) 75 MCG tablet, TAKE 1 TABLET DAILY, Disp: 90 tablet, Rfl: 3   mupirocin  ointment (BACTROBAN ) 2 %, Apply 1 Application topically 2 (two) times daily., Disp: 22 g, Rfl: 0   QUVIVIQ  50 MG TABS, TAKE 1 TABLET BY MOUTH EVERYDAY AT BEDTIME, Disp: 30 tablet, Rfl: 1   Red Yeast Rice Extract 600 MG CAPS, Take 600 mg by mouth daily., Disp: ,  Rfl:    Zinc Sulfate (ZINC 15 PO), Take by mouth., Disp: , Rfl:   EXAM:  GENERAL: alert, oriented, appears well and in no acute distress  HEENT: atraumatic, conjunttiva clear, no obvious abnormalities on inspection of external nose and ears  NECK: normal movements of the head and neck  LUNGS: on inspection no signs of respiratory distress, breathing rate appears normal, no obvious gross SOB, gasping or wheezing  CV: no obvious cyanosis  PSYCH/NEURO: pleasant and cooperative, no obvious depression or anxiety, speech and thought  processing grossly intact  ASSESSMENT AND PLAN:  Discussed the following assessment and plan:  Problem List Items Addressed This Visit     Sleep difficulties - Primary   Discussed her sleep issues.  She has had issues with sleep since treatment of breast cancer.  Reports feeling heat. Has what she describes as brain drain - when lying down.  Occasionally electrical tingling sensation in her legs.  Hard to fall asleep and stay asleep.  May only sleep for a couple of hours.  Has tried relaxation techniques. Also has tried multiple medications - effexor, gabapentin.  Has tried acupuncture and tai chi.  Has most recently been on quiviviq. Is concerned this may be contributing to her vertigo symptoms. Has decreased her dose - 1/2 50mg . Will taper off as directed. Call with update. Had questions about dayvigo. Wants 10 day free trial. Will d/w pharmacy.       Hypothyroidism   On thyroid  replacement.  Follow tsh.       Hypercholesteremia   Low cholesterol diet and exercise.  Follow lipid panel.        Dizziness   Has a history of vertigo. Epley maneuvers have worked previously. Is concerned regarding the possibility of her sleep medication contributing to her current symptoms. Discussed tapering off. See if symptoms resolve. If persistent, discussed ENT evaluation.        Return if symptoms worsen or fail to improve.   I discussed the assessment and treatment plan with the patient. The patient was provided an opportunity to ask questions and all were answered. The patient agreed with the plan and demonstrated an understanding of the instructions.   The patient was advised to call back or seek an in-person evaluation if the symptoms worsen or if the condition fails to improve as anticipated.    Allena Hamilton, MD

## 2023-06-08 ENCOUNTER — Encounter: Payer: Self-pay | Admitting: Internal Medicine

## 2023-06-08 NOTE — Assessment & Plan Note (Signed)
 Low cholesterol diet and exercise.  Follow lipid panel.

## 2023-06-08 NOTE — Assessment & Plan Note (Signed)
 On thyroid replacement.  Follow tsh.

## 2023-06-08 NOTE — Assessment & Plan Note (Signed)
 Has a history of vertigo. Epley maneuvers have worked previously. Is concerned regarding the possibility of her sleep medication contributing to her current symptoms. Discussed tapering off. See if symptoms resolve. If persistent, discussed ENT evaluation.

## 2023-06-08 NOTE — Assessment & Plan Note (Signed)
 Discussed her sleep issues.  She has had issues with sleep since treatment of breast cancer.  Reports feeling heat. Has what she describes as brain drain - when lying down.  Occasionally electrical tingling sensation in her legs.  Hard to fall asleep and stay asleep.  May only sleep for a couple of hours.  Has tried relaxation techniques. Also has tried multiple medications - effexor, gabapentin.  Has tried acupuncture and tai chi.  Has most recently been on quiviviq. Is concerned this may be contributing to her vertigo symptoms. Has decreased her dose - 1/2 50mg . Will taper off as directed. Call with update. Had questions about dayvigo. Wants 10 day free trial. Will d/w pharmacy.

## 2023-07-16 ENCOUNTER — Telehealth: Payer: Self-pay

## 2023-07-16 ENCOUNTER — Other Ambulatory Visit (INDEPENDENT_AMBULATORY_CARE_PROVIDER_SITE_OTHER): Payer: Medicare Other

## 2023-07-16 DIAGNOSIS — E7800 Pure hypercholesterolemia, unspecified: Secondary | ICD-10-CM

## 2023-07-16 LAB — HEPATIC FUNCTION PANEL
ALT: 17 U/L (ref 0–35)
AST: 24 U/L (ref 0–37)
Albumin: 4.4 g/dL (ref 3.5–5.2)
Alkaline Phosphatase: 57 U/L (ref 39–117)
Bilirubin, Direct: 0.1 mg/dL (ref 0.0–0.3)
Total Bilirubin: 0.7 mg/dL (ref 0.2–1.2)
Total Protein: 7.1 g/dL (ref 6.0–8.3)

## 2023-07-16 LAB — BASIC METABOLIC PANEL
BUN: 11 mg/dL (ref 6–23)
CO2: 29 meq/L (ref 19–32)
Calcium: 9.6 mg/dL (ref 8.4–10.5)
Chloride: 101 meq/L (ref 96–112)
Creatinine, Ser: 0.81 mg/dL (ref 0.40–1.20)
GFR: 67.26 mL/min (ref >60.00)
Glucose, Bld: 103 mg/dL — ABNORMAL HIGH (ref 70–99)
Potassium: 4.4 meq/L (ref 3.5–5.1)
Sodium: 137 meq/L (ref 135–145)

## 2023-07-16 LAB — LIPID PANEL
Cholesterol: 195 mg/dL (ref 0–200)
HDL: 83.4 mg/dL (ref >39.00)
LDL Cholesterol: 95 mg/dL (ref 0–99)
NonHDL: 111.53
Total CHOL/HDL Ratio: 2
Triglycerides: 82 mg/dL (ref 0.0–149.0)
VLDL: 16.4 mg/dL (ref 0.0–40.0)

## 2023-07-16 LAB — CBC WITH DIFFERENTIAL/PLATELET
Basophils Absolute: 0 10*3/uL (ref 0.0–0.1)
Basophils Relative: 0.4 % (ref 0.0–3.0)
Eosinophils Absolute: 0.2 10*3/uL (ref 0.0–0.7)
Eosinophils Relative: 3 % (ref 0.0–5.0)
HCT: 40.4 % (ref 36.0–46.0)
Hemoglobin: 13.9 g/dL (ref 12.0–15.0)
Lymphocytes Relative: 21.3 % (ref 12.0–46.0)
Lymphs Abs: 1.1 10*3/uL (ref 0.7–4.0)
MCHC: 34.3 g/dL (ref 30.0–36.0)
MCV: 90.7 fl (ref 78.0–100.0)
Monocytes Absolute: 0.5 10*3/uL (ref 0.1–1.0)
Monocytes Relative: 9.5 % (ref 3.0–12.0)
Neutro Abs: 3.5 10*3/uL (ref 1.4–7.7)
Neutrophils Relative %: 65.8 % (ref 43.0–77.0)
Platelets: 170 10*3/uL (ref 150.0–400.0)
RBC: 4.46 Mil/uL (ref 3.87–5.11)
RDW: 13.4 % (ref 11.5–15.5)
WBC: 5.3 10*3/uL (ref 4.0–10.5)

## 2023-07-16 LAB — TSH: TSH: 0.98 u[IU]/mL (ref 0.35–5.50)

## 2023-07-16 NOTE — Telephone Encounter (Signed)
 Spoke with terri. Order faxed.

## 2023-07-16 NOTE — Telephone Encounter (Signed)
 Copied from CRM [id number]. Topic: General - Other >> Jul 16, 2023  2:22 PM Sim Boast F wrote: Reason for CRM: Barth Kirks from [facility] called to follow up on fax that was sent for patients PT recert. Her call back number is [phone].

## 2023-07-21 ENCOUNTER — Ambulatory Visit (INDEPENDENT_AMBULATORY_CARE_PROVIDER_SITE_OTHER): Payer: Medicare Other | Admitting: Internal Medicine

## 2023-07-21 ENCOUNTER — Encounter: Payer: Self-pay | Admitting: Internal Medicine

## 2023-07-21 VITALS — BP 118/68 | HR 70 | Temp 98.0°F | Resp 16 | Ht 61.0 in | Wt 125.6 lb

## 2023-07-21 DIAGNOSIS — Z0000 Encounter for general adult medical examination without abnormal findings: Secondary | ICD-10-CM

## 2023-07-21 DIAGNOSIS — G479 Sleep disorder, unspecified: Secondary | ICD-10-CM

## 2023-07-21 DIAGNOSIS — E039 Hypothyroidism, unspecified: Secondary | ICD-10-CM

## 2023-07-21 DIAGNOSIS — E7800 Pure hypercholesterolemia, unspecified: Secondary | ICD-10-CM | POA: Diagnosis not present

## 2023-07-21 DIAGNOSIS — Z860100 Personal history of colon polyps, unspecified: Secondary | ICD-10-CM

## 2023-07-21 DIAGNOSIS — Z853 Personal history of malignant neoplasm of breast: Secondary | ICD-10-CM

## 2023-07-21 MED ORDER — LEVOTHYROXINE SODIUM 75 MCG PO TABS: 75.0000 ug | ORAL_TABLET | Freq: Every day | ORAL | 3 refills | Status: DC

## 2023-07-21 NOTE — Assessment & Plan Note (Signed)
On thyroid replacement.  Follow tsh.  Just checked and wnl.

## 2023-07-21 NOTE — Assessment & Plan Note (Signed)
 Requesting copy of colonoscopy.  States was told did not need further colonoscopy.

## 2023-07-21 NOTE — Assessment & Plan Note (Signed)
 Mammogram 04/04/23 - birads II.

## 2023-07-21 NOTE — Assessment & Plan Note (Signed)
 She is off quivifiq. Feels better overall. Wants to hold on any further medication at this time. Follow. Has Remus Loffler and takes 1/4 if needed.

## 2023-07-21 NOTE — Progress Notes (Signed)
 Subjective:    Patient ID: Kayla Navarro, female    DOB: 06/02/1940, 83 y.o.   MRN: [id number]  Patient here for  Chief Complaint  Patient presents with   Annual Exam    HPI With past history of hypercholesterolemia, hypothyroidism, breast cancer and sleep issues, she comes in today to follow up on these issues as well as for a complete physical exam.  She is followed by oncology. They do her breast exams. Need colonoscopy report. Requesting from [facility]. Bone density 05/23/23 - osteopenia. Continue calcium, vitamin D and weight bearing exercise. She exercises regularly. No chest pain or sob reported. No cough or congestion. No abdominal pain or bowel change. Discussed due tetanus.   Past Medical History:  Diagnosis Date   Degenerative disc disease, cervical    GERD (gastroesophageal reflux disease)    History of colon polyps    History of hypertension    Hypercholesteremia    Hypothyroid    Past Surgical History:  Procedure Laterality Date   AUGMENTATION MAMMAPLASTY Left 2009   transflap done on mastectomy    BREAST EXCISIONAL BIOPSY Right    1990's neg   CATARACT EXTRACTION Right 2015   MASTECTOMY  2002   ROTATOR CUFF REPAIR     TONSILLECTOMY AND ADENOIDECTOMY     TRIGGER FINGER RELEASE     Family History  Problem Relation Age of Onset   Stomach cancer Mother    Hypertension Mother    CAD Mother    Hypercholesterolemia Mother    Parkinson's disease Father    Heart disease Father    High Cholesterol Father    Breast cancer Maternal Aunt    Parkinson's disease Paternal Grandmother    Heart disease Brother    Social History   Socioeconomic History   Marital status: Married    Spouse name: Not on file   Number of children: Not on file   Years of education: Not on file   Highest education level: Not on file  Occupational History   Not on file  Tobacco Use   Smoking status: Never   Smokeless tobacco: Never  Substance and Sexual Activity    Alcohol use: Yes    Comment: occasional   Drug use: Never   Sexual activity: Not Currently  Other Topics Concern   Not on file  Social History Narrative   Married   Social Drivers of Health   Financial Resource Strain: Low Risk  (01/24/2023)   Overall Financial Resource Strain (CARDIA)    Difficulty of Paying Living Expenses: Not hard at all  Food Insecurity: No Food Insecurity (01/24/2023)   Hunger Vital Sign    Worried About Running Out of Food in the Last Year: Never true    Ran Out of Food in the Last Year: Never true  Transportation Needs: No Transportation Needs (01/24/2023)   PRAPARE - Administrator, Civil Service (Medical): No    Lack of Transportation (Non-Medical): No  Physical Activity: Sufficiently Active (01/24/2023)   Exercise Vital Sign    Days of Exercise per Week: 4 days    Minutes of Exercise per Session: 40 min  Stress: No Stress Concern Present (01/24/2023)   [employer] of Occupational Health - Occupational Stress Questionnaire    Feeling of Stress : Only a little  Social Connections: Socially Integrated (01/24/2023)   Social Connection and Isolation Panel [NHANES]    Frequency of Communication with Friends and Family: More than three times a week  Frequency of Social Gatherings with Friends and Family: More than three times a week    Attends Religious Services: More than 4 times per year    Active Member of Clubs or Organizations: Yes    Attends Engineer, structural: More than 4 times per year    Marital Status: Married     Review of Systems  Constitutional:  Negative for appetite change and unexpected weight change.  HENT:  Negative for congestion, sinus pressure and sore throat.   Eyes:  Negative for pain and visual disturbance.  Respiratory:  Negative for cough, chest tightness and shortness of breath.   Cardiovascular:  Negative for chest pain, palpitations and leg swelling.  Gastrointestinal:  Negative for abdominal pain,  diarrhea, nausea and vomiting.  Genitourinary:  Negative for difficulty urinating and dysuria.  Musculoskeletal:  Negative for joint swelling and myalgias.  Skin:  Negative for color change and rash.  Neurological:  Negative for dizziness and headaches.  Hematological:  Negative for adenopathy. Does not bruise/bleed easily.  Psychiatric/Behavioral:  Negative for agitation and dysphoric mood.        Objective:     BP 118/68   Pulse 70   Temp 98 F (36.7 C)   Resp 16   Ht 5\' 1"  (1.549 m)   Wt 125 lb 9.6 oz (57 kg)   SpO2 98%   BMI 23.73 kg/m  Wt Readings from Last 3 Encounters:  07/21/23 125 lb 9.6 oz (57 kg)  06/06/23 127 lb (57.6 kg)  01/24/23 127 lb (57.6 kg)    Physical Exam Vitals reviewed.  Constitutional:      General: She is not in acute distress.    Appearance: Normal appearance.  HENT:     Head: Normocephalic and atraumatic.     Right Ear: External ear normal.     Left Ear: External ear normal.     Mouth/Throat:     Pharynx: No oropharyngeal exudate or posterior oropharyngeal erythema.  Eyes:     General: No scleral icterus.       Right eye: No discharge.        Left eye: No discharge.     Conjunctiva/sclera: Conjunctivae normal.  Neck:     Thyroid: No thyromegaly.  Cardiovascular:     Rate and Rhythm: Normal rate and regular rhythm.  Pulmonary:     Effort: No respiratory distress.     Breath sounds: Normal breath sounds. No wheezing.  Abdominal:     General: Bowel sounds are normal.     Palpations: Abdomen is soft.     Tenderness: There is no abdominal tenderness.  Musculoskeletal:        General: No swelling or tenderness.     Cervical back: Neck supple. No tenderness.  Lymphadenopathy:     Cervical: No cervical adenopathy.  Skin:    Findings: No erythema or rash.  Neurological:     Mental Status: She is alert.  Psychiatric:        Mood and Affect: Mood normal.        Behavior: Behavior normal.         Outpatient Encounter  Medications as of 07/21/2023  Medication Sig   Biotin 10 MG TABS Take by mouth.   Calcium Carbonate Antacid (CALCIUM CARBONATE, DOSED IN MG ELEMENTAL CALCIUM,) 1250 MG/5ML SUSP Take by mouth.   Cholecalciferol (VITAMIN D3) 10 MCG (400 UNIT) CAPS Take by mouth.   mupirocin ointment (BACTROBAN) 2 % Apply 1 Application topically 2 (two) times  daily.   Red Yeast Rice Extract 600 MG CAPS Take 600 mg by mouth daily.   Zinc Sulfate (ZINC 15 PO) Take by mouth.   [DISCONTINUED] QUVIVIQ 50 MG TABS TAKE 1 TABLET BY MOUTH EVERYDAY AT BEDTIME   levothyroxine (SYNTHROID) 75 MCG tablet Take 1 tablet (75 mcg total) by mouth daily.   [DISCONTINUED] levothyroxine (SYNTHROID) 75 MCG tablet TAKE 1 TABLET DAILY   No facility-administered encounter medications on file as of 07/21/2023.     Lab Results  Component Value Date   WBC 5.3 07/16/2023   HGB 13.9 07/16/2023   HCT 40.4 07/16/2023   PLT 170.0 07/16/2023   GLUCOSE 103 (H) 07/16/2023   CHOL 195 07/16/2023   TRIG 82.0 07/16/2023   HDL 83.40 07/16/2023   LDLCALC 95 07/16/2023   ALT 17 07/16/2023   AST 24 07/16/2023   NA 137 07/16/2023   K 4.4 07/16/2023   CL 101 07/16/2023   CREATININE 0.81 07/16/2023   BUN 11 07/16/2023   CO2 29 07/16/2023   TSH 0.98 07/16/2023    DG Bone Density Result Date: 05/23/2023 EXAM: DUAL X-RAY ABSORPTIOMETRY (DXA) FOR BONE MINERAL DENSITY IMPRESSION: Dear Dr. Lorin Picket, Your patient Okie Jansson completed a FRAX assessment on 05/23/2023 using the Lunar iDXA DXA System (analysis version: 14.10) manufactured by [employer]. The following summarizes the results of our evaluation. PATIENT BIOGRAPHICAL: Name: Lilliah, Priego Patient ID: [id number] Birth Date: 01-Mar-1941 Height:    61.5 in. Gender:     Female    [age]       Weight:    126.9 lbs. Ethnicity:  White                            Exam Date: 05/23/2023 FRAX* RESULTS:  (version: 3.5) 10-year Probability of Fracture1 Major Osteoporotic Fracture2 Hip Fracture 14.4%  4.3% Population: [place] (Caucasian) Risk Factors: None Based on Femur (Right) Neck BMD 1 -The 10-year probability of fracture may be lower than reported if the patient has received treatment. 2 -Major Osteoporotic Fracture: Clinical Spine, Forearm, Hip or Shoulder *FRAX is a Armed forces logistics/support/administrative officer of the [employer] of [employer] for Metabolic Bone Disease, a World Science writer (WHO) [employer]. ASSESSMENT: The probability of a major osteoporotic fracture is 14.4% within the next ten years. The probability of a hip fracture is 4.3% within the next ten years. . Your patient Jennie Hannay completed a BMD test on 05/23/2023 using the [employer] iDXA DXA System (software version: 14.10) manufactured by [employer]. The following summarizes the results of our evaluation. Technologist: Brand [facility] PATIENT BIOGRAPHICAL: Name: Merriel, Zinger Patient ID: [id number] Birth Date: March 15, 1941 Height: 61.5 in. Gender: Female Exam Date: 05/23/2023 Weight: 126.9 lbs. Indications: Advanced Age, Caucasian, History of Breast Cancer, Hypothyroid, Postmenopausal Fractures: Treatments: Calcium, Synthroid DENSITOMETRY RESULTS: Site      Region     Measured Date Measured Age WHO Classification Young Adult T-score BMD         %Change vs. Previous Significant Change (*) AP Spine L1-L3 05/23/2023 82.9 Normal -0.4 1.132 g/cm2 DualFemur Neck Right 05/23/2023 82.9 Osteopenia -1.8 0.790 g/cm2 ASSESSMENT: The BMD measured at Femur Neck Right is 0.790 g/cm2 with a T-score of -1.8. This patient is considered osteopenic according to World Health Organization [facility]) criteria. The scan quality is good. L-4 was excluded due to degenerative changes. World Health Organization [facility]) criteria for post-menopausal, Caucasian Women: Normal:  T-score at or above -1 SD Osteopenia/low bone mass: T-score between -1 and -2.5 SD Osteoporosis:             T-score at or below -2.5 SD RECOMMENDATIONS: 1. All patients should optimize  calcium and vitamin D intake. 2. Consider FDA-approved medical therapies in postmenopausal women and men aged 65 years and older, based on the following: a. A hip or vertebral(clinical or morphometric) fracture b. T-score < -2.5 at the femoral neck or spine after appropriate evaluation to exclude secondary causes c. Low bone mass (T-score between -1.0 and -2.5 at the femoral neck or spine) and a 10-year probability of a hip fracture > 3% or a 10-year probability of a major osteoporosis-related fracture > 20% based on the [place]-adapted WHO algorithm 3. Clinician judgment and/or patient preferences may indicate treatment for people with 10-year fracture probabilities above or below these levels FOLLOW-UP: People with diagnosed cases of osteoporosis or at high risk for fracture should have regular bone mineral density tests. For patients eligible for Medicare, routine testing is allowed once every 2 years. The testing frequency can be increased to one year for patients who have rapidly progressing disease, those who are receiving or discontinuing medical therapy to restore bone mass, or have additional risk factors. I have reviewed this report, and agree with the above findings. [facility] Electronically Signed   By: Baird Lyons M.D.   On: 05/23/2023 09:25       Assessment & Plan:  Healthcare maintenance Assessment & Plan: Physical today 07/21/23.   Mammogram (right breast) 04/04/23 - Birads II.   Need copy of last colonoscopy.  Requested. Bone density as outlined.    Hypercholesteremia Assessment & Plan: Discussed recent lab results. Low cholesterol diet and exercise. Follow lipid panel.   Lab Results  Component Value Date   CHOL 195 07/16/2023   HDL 83.40 07/16/2023   LDLCALC 95 07/16/2023   TRIG 82.0 07/16/2023   CHOLHDL 2 07/16/2023     Orders: -     Basic metabolic panel; Future -     Hepatic function panel; Future -     Lipid panel; Future  Sleep difficulties Assessment &  Plan: She is off quivifiq. Feels better overall. Wants to hold on any further medication at this time. Follow. Has Remus Loffler and takes 1/4 if needed.    Hypothyroidism, unspecified type Assessment & Plan: On thyroid replacement.  Follow tsh. Just checked and wnl.    History of colon polyps Assessment & Plan: Requesting copy of colonoscopy.  States was told did not need further colonoscopy.    History of breast cancer Assessment & Plan: Mammogram 04/04/23 - birads II.    Other orders -     Levothyroxine Sodium; Take 1 tablet (75 mcg total) by mouth daily.  Dispense: 90 tablet; Refill: 3     Dale Loiza, MD

## 2023-07-21 NOTE — Assessment & Plan Note (Signed)
 Discussed recent lab results. Low cholesterol diet and exercise. Follow lipid panel.   Lab Results  Component Value Date   CHOL 195 07/16/2023   HDL 83.40 07/16/2023   LDLCALC 95 07/16/2023   TRIG 82.0 07/16/2023   CHOLHDL 2 07/16/2023

## 2023-07-21 NOTE — Assessment & Plan Note (Addendum)
 Physical today 07/21/23.   Mammogram (right breast) 04/04/23 - Birads II.   Need copy of last colonoscopy.  Requested. Bone density as outlined.

## 2023-07-23 ENCOUNTER — Encounter: Payer: Self-pay | Admitting: Internal Medicine

## 2023-07-23 ENCOUNTER — Ambulatory Visit: Payer: Self-pay | Admitting: Internal Medicine

## 2023-07-23 NOTE — Telephone Encounter (Signed)
 Noted. Please request a copy of colonoscopy.  Thanks.

## 2023-07-23 NOTE — Telephone Encounter (Signed)
 Records requested

## 2023-11-04 ENCOUNTER — Other Ambulatory Visit: Payer: Self-pay | Admitting: Internal Medicine

## 2023-11-10 ENCOUNTER — Telehealth: Payer: Self-pay

## 2023-11-10 DIAGNOSIS — Z853 Personal history of malignant neoplasm of breast: Secondary | ICD-10-CM

## 2023-11-10 DIAGNOSIS — N651 Disproportion of reconstructed breast: Secondary | ICD-10-CM

## 2023-11-10 NOTE — Telephone Encounter (Signed)
 Lvm to notify pt that pcp is currently out of the office this week. Order will be handled when she returns. Okay to relay message

## 2023-11-10 NOTE — Telephone Encounter (Signed)
 Copied from CRM [id number]. Topic: Clinical - Order For Equipment >> Nov 10, 2023 10:30 AM Suzen RAMAN wrote: Reason for CRM: Patient would like an order faxed for a Breast prostheses placed as well a bra to. Per patient previous order has expired.  [facility] [employer] [address] [place], [place] [address] Phone# [phone] Fax# [phone]  Patient CB#[phone].

## 2023-11-13 NOTE — Telephone Encounter (Signed)
 Dr Lowery originally gave the order to [facility] for her breast prosthesis. Are you ok with signing order? If so I will have them send order form.

## 2023-11-14 NOTE — Telephone Encounter (Signed)
 LM for [facility] to return my call.

## 2023-11-14 NOTE — Telephone Encounter (Signed)
 Ok to have them send over order form and let us  see what is needed - to confirm we are able to sign.

## 2023-11-20 NOTE — Telephone Encounter (Signed)
 [facility] was unable to find a recent order in their system. Ellouise stated that they may not have scanned it in or could have her name misspelled.She asked that I send over generic DME order for masectomy supplies and she will reach out to the patient for the rest. I have printed DME.

## 2023-11-20 NOTE — Telephone Encounter (Signed)
 Order requested from [facility]. They should be faxing over.

## 2023-11-20 NOTE — Addendum Note (Signed)
 Addended by: LEARTA PORTO D on: 11/20/2023 01:22 PM   Modules accepted: Orders

## 2023-11-20 NOTE — Telephone Encounter (Signed)
 Signed and placed in box.

## 2023-11-20 NOTE — Telephone Encounter (Unsigned)
 Copied from CRM #[id number]. Topic: General - Other >> Nov 20, 2023  1:00 PM Franky GRADE wrote: Reason for CRM: Ellouise from [facility] is calling to speak with [name], she would like a bit more information regarding the patient as there is not information on the patient on their record. Best call back number is [phone].

## 2023-11-21 NOTE — Telephone Encounter (Signed)
 Faxed to [facility] [employer]

## 2023-11-25 NOTE — Telephone Encounter (Signed)
 Patient is going to call [facility] and follow up about getting fitted. Her husband had hip surgery so she has put this on hold but will reach out.

## 2024-01-14 ENCOUNTER — Other Ambulatory Visit (INDEPENDENT_AMBULATORY_CARE_PROVIDER_SITE_OTHER)

## 2024-01-14 ENCOUNTER — Ambulatory Visit: Payer: Self-pay | Admitting: Internal Medicine

## 2024-01-14 DIAGNOSIS — E7800 Pure hypercholesterolemia, unspecified: Secondary | ICD-10-CM | POA: Diagnosis not present

## 2024-01-14 LAB — LIPID PANEL
Cholesterol: 214 mg/dL — ABNORMAL HIGH (ref 0–200)
HDL: 84.4 mg/dL (ref >39.00)
LDL Cholesterol: 112 mg/dL — ABNORMAL HIGH (ref 0–99)
NonHDL: 129.42
Total CHOL/HDL Ratio: 3
Triglycerides: 85 mg/dL (ref 0.0–149.0)
VLDL: 17 mg/dL (ref 0.0–40.0)

## 2024-01-14 LAB — BASIC METABOLIC PANEL WITH GFR
BUN: 20 mg/dL (ref 6–23)
CO2: 29 meq/L (ref 19–32)
Calcium: 9.5 mg/dL (ref 8.4–10.5)
Chloride: 99 meq/L (ref 96–112)
Creatinine, Ser: 0.74 mg/dL (ref 0.40–1.20)
GFR: 74.71 mL/min (ref >60.00)
Glucose, Bld: 85 mg/dL (ref 70–99)
Potassium: 5 meq/L (ref 3.5–5.1)
Sodium: 136 meq/L (ref 135–145)

## 2024-01-14 LAB — HEPATIC FUNCTION PANEL
ALT: 14 U/L (ref 0–35)
AST: 24 U/L (ref 0–37)
Albumin: 4.6 g/dL (ref 3.5–5.2)
Alkaline Phosphatase: 49 U/L (ref 39–117)
Bilirubin, Direct: 0.1 mg/dL (ref 0.0–0.3)
Total Bilirubin: 0.8 mg/dL (ref 0.2–1.2)
Total Protein: 6.9 g/dL (ref 6.0–8.3)

## 2024-01-19 ENCOUNTER — Ambulatory Visit (INDEPENDENT_AMBULATORY_CARE_PROVIDER_SITE_OTHER): Admitting: Internal Medicine

## 2024-01-19 ENCOUNTER — Encounter: Payer: Self-pay | Admitting: Internal Medicine

## 2024-01-19 ENCOUNTER — Telehealth: Payer: Self-pay

## 2024-01-19 VITALS — BP 132/70 | HR 90 | Resp 16 | Ht 61.0 in | Wt 127.4 lb

## 2024-01-19 DIAGNOSIS — R42 Dizziness and giddiness: Secondary | ICD-10-CM | POA: Diagnosis not present

## 2024-01-19 DIAGNOSIS — E039 Hypothyroidism, unspecified: Secondary | ICD-10-CM

## 2024-01-19 DIAGNOSIS — Z860100 Personal history of colon polyps, unspecified: Secondary | ICD-10-CM

## 2024-01-19 DIAGNOSIS — M5030 Other cervical disc degeneration, unspecified cervical region: Secondary | ICD-10-CM

## 2024-01-19 DIAGNOSIS — Z8673 Personal history of transient ischemic attack (TIA), and cerebral infarction without residual deficits: Secondary | ICD-10-CM

## 2024-01-19 DIAGNOSIS — E7800 Pure hypercholesterolemia, unspecified: Secondary | ICD-10-CM

## 2024-01-19 DIAGNOSIS — Z1231 Encounter for screening mammogram for malignant neoplasm of breast: Secondary | ICD-10-CM | POA: Diagnosis not present

## 2024-01-19 DIAGNOSIS — R030 Elevated blood-pressure reading, without diagnosis of hypertension: Secondary | ICD-10-CM

## 2024-01-19 DIAGNOSIS — Z853 Personal history of malignant neoplasm of breast: Secondary | ICD-10-CM

## 2024-01-19 NOTE — Telephone Encounter (Signed)
 Copied from CRM #[id number]. Topic: Appointments - Scheduling Inquiry for Clinic >> Jan 19, 2024  3:05 PM Martinique E wrote: Reason for CRM: Patient called in wanting to schedule her physical, but [facility] was the only option populating for agent. Patient already has her [facility] scheduled for October, but is wanting a physical as well. Callback number [phone].  I left a message with patient's husband asking him to please have patient call us .  E2C2 - when patient calls back, please let her know that her insurance does not allow for a physical, but she does have an office visit scheduled with Dr. Allena Hamilton for 02/17/2024.

## 2024-01-19 NOTE — Progress Notes (Signed)
 Subjective:    Patient ID: Kayla Navarro, female    DOB: January 27, 1941, 83 y.o.   MRN: [id number]  Patient here for  Chief Complaint  Patient presents with   Medical Management of Chronic Issues    HPI Here for a scheduled follow up - follow up regarding hypercholesterolemia, hypothyroidism, breast cancer and sleep issue. She is followed by oncology. Reported in May of 2016, she had an episode which involved slight headache and not being able to say what she wanted to say. Was worked up  - [employer] out of state. Had brain scan, carotid ultrasound and ECHO. States scans ok. Was placed on blood pressure medication. States starting in 2020 - noticed a funny sensation in her head - w/up - referred to rehab - helped. Reports that one month ago, noticed pressure in her head. Head felt funny. Some sinus pressure. Increased stress. Has neck issues. If she sleeps on her left side - ear soreness. She is doing her exercises and does Tai Chi. Not on any blood pressure medication currently. No chest pain or sob reported. Eating. No vomiting or GI issues reported.   Colonoscopy report? [facility]   Past Medical History:  Diagnosis Date   Degenerative disc disease, cervical    GERD (gastroesophageal reflux disease)    History of colon polyps    History of hypertension    Hypercholesteremia    Hypothyroid    Past Surgical History:  Procedure Laterality Date   AUGMENTATION MAMMAPLASTY Left 2009   transflap done on mastectomy    BREAST EXCISIONAL BIOPSY Right    1990's neg   CATARACT EXTRACTION Right 2015   MASTECTOMY  2002   ROTATOR CUFF REPAIR     TONSILLECTOMY AND ADENOIDECTOMY     TRIGGER FINGER RELEASE     Family History  Problem Relation Age of Onset   Stomach cancer Mother    Hypertension Mother    CAD Mother    Hypercholesterolemia Mother    Parkinson's disease Father    Heart disease Father    High Cholesterol Father    Breast cancer Maternal Aunt    Parkinson's  disease Paternal Grandmother    Heart disease Brother    Social History   Socioeconomic History   Marital status: Married    Spouse name: Not on file   Number of children: Not on file   Years of education: Not on file   Highest education level: Not on file  Occupational History   Not on file  Tobacco Use   Smoking status: Never   Smokeless tobacco: Never  Substance and Sexual Activity   Alcohol use: Yes    Comment: occasional   Drug use: Never   Sexual activity: Not Currently  Other Topics Concern   Not on file  Social History Narrative   Married   Social Drivers of Health   Financial Resource Strain: Low Risk  (01/24/2023)   Overall Financial Resource Strain (CARDIA)    Difficulty of Paying Living Expenses: Not hard at all  Food Insecurity: No Food Insecurity (01/24/2023)   Hunger Vital Sign    Worried About Running Out of Food in the Last Year: Never true    Ran Out of Food in the Last Year: Never true  Transportation Needs: No Transportation Needs (01/24/2023)   PRAPARE - Administrator, Civil Service (Medical): No    Lack of Transportation (Non-Medical): No  Physical Activity: Sufficiently Active (01/24/2023)   Exercise Vital  Sign    Days of Exercise per Week: 4 days    Minutes of Exercise per Session: 40 min  Stress: No Stress Concern Present (01/24/2023)   [employer] of Occupational Health - Occupational Stress Questionnaire    Feeling of Stress : Only a little  Social Connections: Socially Integrated (01/24/2023)   Social Connection and Isolation Panel    Frequency of Communication with Friends and Family: More than three times a week    Frequency of Social Gatherings with Friends and Family: More than three times a week    Attends Religious Services: More than 4 times per year    Active Member of [employer] or Organizations: Yes    Attends Engineer, structural: More than 4 times per year    Marital Status: Married     Review of Systems   Constitutional:  Negative for appetite change and unexpected weight change.  HENT:  Negative for congestion and sinus pressure.   Respiratory:  Negative for cough, chest tightness and shortness of breath.   Cardiovascular:  Negative for chest pain, palpitations and leg swelling.  Gastrointestinal:  Negative for abdominal pain, diarrhea, nausea and vomiting.  Genitourinary:  Negative for difficulty urinating and dysuria.  Musculoskeletal:  Negative for joint swelling and myalgias.  Skin:  Negative for color change and rash.  Neurological:  Negative for dizziness.       No significant headache or room spinning dizziness. Describes sensation change in head as outlined.   Psychiatric/Behavioral:  Negative for agitation and dysphoric mood.        Objective:     BP 132/70   Pulse 90   Resp 16   Ht 5' 1 (1.549 m)   Wt 127 lb 6.4 oz (57.8 kg)   SpO2 98%   BMI 24.07 kg/m  Wt Readings from Last 3 Encounters:  01/19/24 127 lb 6.4 oz (57.8 kg)  07/21/23 125 lb 9.6 oz (57 kg)  06/06/23 127 lb (57.6 kg)    Physical Exam Vitals reviewed.  Constitutional:      General: She is not in acute distress.    Appearance: Normal appearance.  HENT:     Head: Normocephalic and atraumatic.     Right Ear: External ear normal.     Left Ear: External ear normal.     Mouth/Throat:     Pharynx: No oropharyngeal exudate or posterior oropharyngeal erythema.  Eyes:     General: No scleral icterus.       Right eye: No discharge.        Left eye: No discharge.     Conjunctiva/sclera: Conjunctivae normal.  Neck:     Thyroid : No thyromegaly.  Cardiovascular:     Rate and Rhythm: Normal rate and regular rhythm.  Pulmonary:     Effort: No respiratory distress.     Breath sounds: Normal breath sounds. No wheezing.  Abdominal:     General: Bowel sounds are normal.     Palpations: Abdomen is soft.     Tenderness: There is no abdominal tenderness.  Musculoskeletal:        General: No swelling or  tenderness.     Cervical back: Neck supple. No tenderness.  Lymphadenopathy:     Cervical: No cervical adenopathy.  Skin:    Findings: No erythema or rash.  Neurological:     Mental Status: She is alert.  Psychiatric:        Mood and Affect: Mood normal.  Behavior: Behavior normal.         Outpatient Encounter Medications as of 01/19/2024  Medication Sig   Biotin 10 MG TABS Take by mouth.   Calcium Carbonate Antacid (CALCIUM CARBONATE, DOSED IN MG ELEMENTAL CALCIUM,) 1250 MG/5ML SUSP Take by mouth.   Cholecalciferol (VITAMIN D3) 10 MCG (400 UNIT) CAPS Take by mouth. (Patient not taking: Reported on 01/19/2024)   levothyroxine  (SYNTHROID ) 75 MCG tablet TAKE 1 TABLET DAILY   mupirocin  ointment (BACTROBAN ) 2 % Apply 1 Application topically 2 (two) times daily.   Red Yeast Rice Extract 600 MG CAPS Take 600 mg by mouth daily.   Zinc Sulfate (ZINC 15 PO) Take by mouth.   No facility-administered encounter medications on file as of 01/19/2024.     Lab Results  Component Value Date   WBC 5.3 07/16/2023   HGB 13.9 07/16/2023   HCT 40.4 07/16/2023   PLT 170.0 07/16/2023   GLUCOSE 85 01/14/2024   CHOL 214 (H) 01/14/2024   TRIG 85.0 01/14/2024   HDL 84.40 01/14/2024   LDLCALC 112 (H) 01/14/2024   ALT 14 01/14/2024   AST 24 01/14/2024   NA 136 01/14/2024   K 5.0 01/14/2024   CL 99 01/14/2024   CREATININE 0.74 01/14/2024   BUN 20 01/14/2024   CO2 29 01/14/2024   TSH 0.98 07/16/2023    DG Bone Density Result Date: 05/23/2023 EXAM: DUAL X-RAY ABSORPTIOMETRY (DXA) FOR BONE MINERAL DENSITY IMPRESSION: Dear Dr. Glendia, Your patient Meliya Mcconahy completed a FRAX assessment on 05/23/2023 using the Lunar iDXA DXA System (analysis version: 14.10) manufactured by [employer]. The following summarizes the results of our evaluation. PATIENT BIOGRAPHICAL: Name: Lyly, Canizales Patient ID: [id number] Birth Date: Mar 16, 1941 Height:    61.5 in. Gender:     [age]    [age]        Weight:    126.9 lbs. Ethnicity:  White                            Exam Date: 05/23/2023 FRAX* RESULTS:  (version: 3.5) 10-year Probability of Fracture1 Major Osteoporotic Fracture2 Hip Fracture 14.4% 4.3% Population: [place]  (Caucasian) Risk Factors: None Based on Femur (Right) Neck BMD 1 -The 10-year probability of fracture may be lower than reported if the patient has received treatment. 2 -Major Osteoporotic Fracture: Clinical Spine, Forearm, Hip or Shoulder *FRAX is a Armed forces logistics/support/administrative officer of the [employer] of [employer] for Metabolic Bone Disease, a World Science writer (WHO) [employer]. ASSESSMENT: The probability of a major osteoporotic fracture is 14.4% within the next ten years. The probability of a hip fracture is 4.3% within the next ten years. . Your patient Wenonah Milo completed a BMD test on 05/23/2023 using the [employer] iDXA DXA System (software version: 14.10) manufactured by [employer]. The following summarizes the results of our evaluation. Technologist: [facility] PATIENT BIOGRAPHICAL: Name: Lilliauna, Van Patient ID: [id number] Birth Date: 03/24/1941 Height: 61.5 in. Gender: Female Exam Date: 05/23/2023 Weight: 126.9 lbs. Indications: Advanced Age, Caucasian, History of Breast Cancer, Hypothyroid, Postmenopausal Fractures: Treatments: Calcium, Synthroid  DENSITOMETRY RESULTS: Site      Region     Measured Date Measured Age WHO Classification Young Adult T-score BMD         %Change vs. Previous Significant Change (*) AP Spine L1-L3 05/23/2023 82.9 Normal -0.4 1.132 g/cm2 DualFemur Neck Right 05/23/2023 82.9 Osteopenia -1.8 0.790 g/cm2 ASSESSMENT: The BMD  measured at Femur Neck Right is 0.790 g/cm2 with a T-score of -1.8. This patient is considered osteopenic according to World Health Organization [facility]) criteria. The scan quality is good. L-4 was excluded due to degenerative changes. World Science writer [facility]) criteria for post-menopausal, Caucasian Women: Normal:                    T-score at or above -1 SD Osteopenia/low bone mass: T-score between -1 and -2.5 SD Osteoporosis:             T-score at or below -2.5 SD RECOMMENDATIONS: 1. All patients should optimize calcium and vitamin D intake. 2. Consider FDA-approved medical therapies in postmenopausal women and men aged 60 years and older, based on the following: a. A hip or vertebral(clinical or morphometric) fracture b. T-score < -2.5 at the femoral neck or spine after appropriate evaluation to exclude secondary causes c. Low bone mass (T-score between -1.0 and -2.5 at the femoral neck or spine) and a 10-year probability of a hip fracture > 3% or a 10-year probability of a major osteoporosis-related fracture > 20% based on the [place] -adapted WHO algorithm 3. Clinician judgment and/or patient preferences may indicate treatment for people with 10-year fracture probabilities above or below these levels FOLLOW-UP: People with diagnosed cases of osteoporosis or at high risk for fracture should have regular bone mineral density tests. For patients eligible for Medicare, routine testing is allowed once every 2 years. The testing frequency can be increased to one year for patients who have rapidly progressing disease, those who are receiving or discontinuing medical therapy to restore bone mass, or have additional risk factors. I have reviewed this report, and agree with the above findings. [facility] Electronically Signed   By: Dina  Arceo M.D.   On: 05/23/2023 09:25       Assessment & Plan:  Hypercholesteremia Assessment & Plan: Discussed recent lab results. Low cholesterol diet and exercise. Follow lipid panel.   Lab Results  Component Value Date   CHOL 214 (H) 01/14/2024   HDL 84.40 01/14/2024   LDLCALC 112 (H) 01/14/2024   TRIG 85.0 01/14/2024   CHOLHDL 3 01/14/2024     Orders: -     CBC with Differential/Platelet; Future -     Basic metabolic panel with GFR; Future -     Hepatic function panel;  Future -     Lipid panel; Future -     TSH; Future  Visit for screening mammogram -     3D Screening Mammogram, Left and Right; Future  Degenerative disc disease, cervical Assessment & Plan: Continues with stretches, exercise - Tai Chi. Follow. Notify me if feels needs further intervention.    Dizziness Assessment & Plan: Has a history of vertigo. Epley maneuvers have worked previously. Currently with no room spinning dizziness. Occasional sensation change as outlined. Discussed reevaluation - ENT, neurology - follow.    Elevated blood pressure reading Assessment & Plan: Outside blood pressure readings reviewed - most averaging 115-130/70s. Hold on medication at this time. Follow pressures. Follow metabolic panel.    Hypothyroidism, unspecified type Assessment & Plan: On thyroid  replacement.  Follow tsh.    History of colon polyps Assessment & Plan: Again requesting copy of colonoscopy.  States was told did not need further colonoscopy.    History of breast cancer Assessment & Plan: Mammogram 04/04/23 - birads II.    History of TIA (transient ischemic attack) Assessment & Plan: Describes the episode she had in 2016.  W/up unrevealing per her report. Appears to be c/w TIA. Intermittent sensation changes as outlined. Has a history of vertigo and has been worked up and treated by ENT. Discussed reevaluation. Discussed MRI. Blood pressure as outlined. Exercises regularly. Discussed 81mg  aspiring daily. Will notify me if desires further testing/evaluation.       Allena Hamilton, MD

## 2024-01-19 NOTE — Assessment & Plan Note (Signed)
 Discussed recent lab results. Low cholesterol diet and exercise. Follow lipid panel.   Lab Results  Component Value Date   CHOL 214 (H) 01/14/2024   HDL 84.40 01/14/2024   LDLCALC 112 (H) 01/14/2024   TRIG 85.0 01/14/2024   CHOLHDL 3 01/14/2024

## 2024-01-19 NOTE — Telephone Encounter (Signed)
 Error

## 2024-01-25 ENCOUNTER — Encounter: Payer: Self-pay | Admitting: Internal Medicine

## 2024-01-25 DIAGNOSIS — Z8673 Personal history of transient ischemic attack (TIA), and cerebral infarction without residual deficits: Secondary | ICD-10-CM | POA: Insufficient documentation

## 2024-01-25 NOTE — Assessment & Plan Note (Signed)
 Outside blood pressure readings reviewed - most averaging 115-130/70s. Hold on medication at this time. Follow pressures. Follow metabolic panel.

## 2024-01-25 NOTE — Assessment & Plan Note (Signed)
 Again requesting copy of colonoscopy.  States was told did not need further colonoscopy.

## 2024-01-25 NOTE — Assessment & Plan Note (Signed)
 Continues with stretches, exercise - Tai Chi. Follow. Notify me if feels needs further intervention.

## 2024-01-25 NOTE — Assessment & Plan Note (Signed)
 Mammogram 04/04/23 - birads II.

## 2024-01-25 NOTE — Assessment & Plan Note (Signed)
 Has a history of vertigo. Epley maneuvers have worked previously. Currently with no room spinning dizziness. Occasional sensation change as outlined. Discussed reevaluation - ENT, neurology - follow.

## 2024-01-25 NOTE — Assessment & Plan Note (Signed)
 On thyroid replacement.  Follow tsh.

## 2024-01-25 NOTE — Assessment & Plan Note (Signed)
 Describes the episode she had in 2016. W/up unrevealing per her report. Appears to be c/w TIA. Intermittent sensation changes as outlined. Has a history of vertigo and has been worked up and treated by ENT. Discussed reevaluation. Discussed MRI. Blood pressure as outlined. Exercises regularly. Discussed 81mg  aspiring daily. Will notify me if desires further testing/evaluation.

## 2024-01-28 ENCOUNTER — Ambulatory Visit: Payer: Medicare Other

## 2024-02-17 ENCOUNTER — Encounter: Payer: Self-pay | Admitting: Internal Medicine

## 2024-02-17 ENCOUNTER — Ambulatory Visit (INDEPENDENT_AMBULATORY_CARE_PROVIDER_SITE_OTHER): Admitting: Internal Medicine

## 2024-02-17 VITALS — BP 118/60 | HR 76 | Temp 97.5°F | Ht 61.0 in | Wt 126.8 lb

## 2024-02-17 DIAGNOSIS — E039 Hypothyroidism, unspecified: Secondary | ICD-10-CM | POA: Diagnosis not present

## 2024-02-17 DIAGNOSIS — E7800 Pure hypercholesterolemia, unspecified: Secondary | ICD-10-CM

## 2024-02-17 DIAGNOSIS — Z853 Personal history of malignant neoplasm of breast: Secondary | ICD-10-CM | POA: Diagnosis not present

## 2024-02-17 DIAGNOSIS — R42 Dizziness and giddiness: Secondary | ICD-10-CM

## 2024-02-17 DIAGNOSIS — Z8673 Personal history of transient ischemic attack (TIA), and cerebral infarction without residual deficits: Secondary | ICD-10-CM | POA: Diagnosis not present

## 2024-02-17 NOTE — Progress Notes (Signed)
 Subjective:    Patient ID: Kayla Navarro, female    DOB: [birth year], 83 y.o.   MRN: [id number]  Patient here for  Chief Complaint  Patient presents with   Joint Swelling    HPI Here for a scheduled follow up - follow up regarding hypercholesterolemia, hypothyroidism, breast cancer and sleep issue. She is followed by oncology. Last visit, discussed ntermittent sensation changes as outlined. Has a history of vertigo and has been worked up and treated by ENT. Discussed reevaluation. Discussed MRI. Discussed neurology referral. Blood pressure as outlined. Exercises regularly. Has been doing Tai Chi. Discussed 81mg  aspiring daily. Will notify me if desires further testing/evaluation. Stays active. No chest pain. Breathing stable. Had questions about spike protein with covid infection.    Past Medical History:  Diagnosis Date   Degenerative disc disease, cervical    GERD (gastroesophageal reflux disease)    History of colon polyps    History of hypertension    Hypercholesteremia    Hypothyroid    Past Surgical History:  Procedure Laterality Date   AUGMENTATION MAMMAPLASTY Left 2009   transflap done on mastectomy    BREAST EXCISIONAL BIOPSY Right    1990's neg   CATARACT EXTRACTION Right 2015   MASTECTOMY  2002   ROTATOR CUFF REPAIR     TONSILLECTOMY AND ADENOIDECTOMY     TRIGGER FINGER RELEASE     Family History  Problem Relation Age of Onset   Stomach cancer Mother    Hypertension Mother    CAD Mother    Hypercholesterolemia Mother    Parkinson's disease Father    Heart disease Father    High Cholesterol Father    Heart disease Brother    Cancer - Other Brother    Breast cancer Maternal Aunt    Parkinson's disease Paternal Grandmother    Social History   Socioeconomic History   Marital status: Married    Spouse name: Not on file   Number of children: Not on file   Years of education: Not on file   Highest education level: Not on file  Occupational History   Not  on file  Tobacco Use   Smoking status: Never   Smokeless tobacco: Never  Substance and Sexual Activity   Alcohol use: Yes    Comment: occasional   Drug use: Never   Sexual activity: Not Currently  Other Topics Concern   Not on file  Social History Narrative   Married   Social Drivers of Health   Financial Resource Strain: Low Risk  (01/24/2023)   Overall Financial Resource Strain (CARDIA)    Difficulty of Paying Living Expenses: Not hard at all  Food Insecurity: No Food Insecurity (01/24/2023)   Hunger Vital Sign    Worried About Running Out of Food in the Last Year: Never true    Ran Out of Food in the Last Year: Never true  Transportation Needs: No Transportation Needs (01/24/2023)   PRAPARE - Administrator, Civil Service (Medical): No    Lack of Transportation (Non-Medical): No  Physical Activity: Sufficiently Active (01/24/2023)   Exercise Vital Sign    Days of Exercise per Week: 4 days    Minutes of Exercise per Session: 40 min  Stress: No Stress Concern Present (01/24/2023)   [employer] of Occupational Health - Occupational Stress Questionnaire    Feeling of Stress : Only a little  Social Connections: Socially Integrated (01/24/2023)   Social Connection and Isolation Panel    Frequency  of Communication with Friends and Family: More than three times a week    Frequency of Social Gatherings with Friends and Family: More than three times a week    Attends Religious Services: More than 4 times per year    Active Member of [employer] or Organizations: Yes    Attends Engineer, Structural: More than 4 times per year    Marital Status: Married     Review of Systems  Constitutional:  Negative for appetite change and unexpected weight change.  HENT:  Negative for congestion and sinus pressure.   Respiratory:  Negative for cough, chest tightness and shortness of breath.   Cardiovascular:  Negative for chest pain, palpitations and leg swelling.   Gastrointestinal:  Negative for abdominal pain, diarrhea, nausea and vomiting.  Genitourinary:  Negative for difficulty urinating and dysuria.  Musculoskeletal:  Negative for joint swelling and myalgias.  Skin:  Negative for color change and rash.  Neurological:  Negative for headaches.       No significant dizziness currently.   Psychiatric/Behavioral:  Negative for agitation and dysphoric mood.        Objective:     BP 118/60 (BP Location: Left Arm, Patient Position: Sitting, Cuff Size: Normal)   Pulse 76   Temp (!) 97.5 F (36.4 C) (Oral)   Ht 5' 1 (1.549 m)   Wt 126 lb 12.8 oz (57.5 kg)   SpO2 97%   BMI 23.96 kg/m  Wt Readings from Last 3 Encounters:  02/17/24 126 lb 12.8 oz (57.5 kg)  01/19/24 127 lb 6.4 oz (57.8 kg)  07/21/23 125 lb 9.6 oz (57 kg)    Physical Exam Vitals reviewed.  Constitutional:      General: She is not in acute distress.    Appearance: Normal appearance.  HENT:     Head: Normocephalic and atraumatic.     Right Ear: External ear normal.     Left Ear: External ear normal.     Mouth/Throat:     Pharynx: No oropharyngeal exudate or posterior oropharyngeal erythema.  Eyes:     General: No scleral icterus.       Right eye: No discharge.        Left eye: No discharge.     Conjunctiva/sclera: Conjunctivae normal.  Neck:     Thyroid : No thyromegaly.  Cardiovascular:     Rate and Rhythm: Normal rate and regular rhythm.  Pulmonary:     Effort: No respiratory distress.     Breath sounds: Normal breath sounds. No wheezing.  Abdominal:     General: Bowel sounds are normal.     Palpations: Abdomen is soft.     Tenderness: There is no abdominal tenderness.  Musculoskeletal:        General: No swelling or tenderness.     Cervical back: Neck supple. No tenderness.  Lymphadenopathy:     Cervical: No cervical adenopathy.  Skin:    Findings: No erythema or rash.  Neurological:     Mental Status: She is alert.  Psychiatric:        Mood and  Affect: Mood normal.        Behavior: Behavior normal.         Outpatient Encounter Medications as of 02/17/2024  Medication Sig   Biotin 10 MG TABS Take by mouth.   Calcium Carbonate Antacid (CALCIUM CARBONATE, DOSED IN MG ELEMENTAL CALCIUM,) 1250 MG/5ML SUSP Take by mouth.   Cholecalciferol (VITAMIN D3) 10 MCG (400 UNIT) CAPS Take by  mouth.   levothyroxine  (SYNTHROID ) 75 MCG tablet TAKE 1 TABLET DAILY   mupirocin  ointment (BACTROBAN ) 2 % Apply 1 Application topically 2 (two) times daily.   Red Yeast Rice Extract 600 MG CAPS Take 600 mg by mouth daily.   Zinc Sulfate (ZINC 15 PO) Take by mouth. (Patient not taking: Reported on 02/17/2024)   No facility-administered encounter medications on file as of 02/17/2024.     Lab Results  Component Value Date   WBC 5.3 07/16/2023   HGB 13.9 07/16/2023   HCT 40.4 07/16/2023   PLT 170.0 07/16/2023   GLUCOSE 85 01/14/2024   CHOL 214 (H) 01/14/2024   TRIG 85.0 01/14/2024   HDL 84.40 01/14/2024   LDLCALC 112 (H) 01/14/2024   ALT 14 01/14/2024   AST 24 01/14/2024   NA 136 01/14/2024   K 5.0 01/14/2024   CL 99 01/14/2024   CREATININE 0.74 01/14/2024   BUN 20 01/14/2024   CO2 29 01/14/2024   TSH 0.98 07/16/2023    DG Bone Density Result Date: 05/23/2023 EXAM: DUAL X-RAY ABSORPTIOMETRY (DXA) FOR BONE MINERAL DENSITY IMPRESSION: Dear Dr. Glendia, Your patient Aeisha Minarik completed a FRAX assessment on 05/23/2023 using the Lunar iDXA DXA System (analysis version: 14.10) manufactured by [employer]. The following summarizes the results of our evaluation. PATIENT BIOGRAPHICAL: Name: Mekiah, Wahler Patient ID: [id number] Birth Date: [birth year] Height:    61.5 in. Gender:     Female    [age]       Weight:    126.9 lbs. Ethnicity:  White                            Exam Date: 05/23/2023 FRAX* RESULTS:  (version: 3.5) 10-year Probability of Fracture1 Major Osteoporotic Fracture2 Hip Fracture 14.4% 4.3% Population: [place]  (Caucasian) Risk  Factors: None Based on Femur (Right) Neck BMD 1 -The 10-year probability of fracture may be lower than reported if the patient has received treatment. 2 -Major Osteoporotic Fracture: Clinical Spine, Forearm, Hip or Shoulder *FRAX is a armed forces logistics/support/administrative officer of the [employer] of [employer] for Metabolic Bone Disease, a World Science Writer (WHO) [employer]. ASSESSMENT: The probability of a major osteoporotic fracture is 14.4% within the next ten years. The probability of a hip fracture is 4.3% within the next ten years. . Your patient Amara Justen completed a BMD test on 05/23/2023 using the [employer] iDXA DXA System (software version: 14.10) manufactured by [employer]. The following summarizes the results of our evaluation. Technologist: [facility] PATIENT BIOGRAPHICAL: Name: Ianna, Salmela Patient ID: [id number] Birth Date: 21-Mar-1941 Height: 61.5 in. Gender: Female Exam Date: 05/23/2023 Weight: 126.9 lbs. Indications: Advanced Age, Caucasian, History of Breast Cancer, Hypothyroid, Postmenopausal Fractures: Treatments: Calcium, Synthroid  DENSITOMETRY RESULTS: Site      Region     Measured Date Measured Age WHO Classification Young Adult T-score BMD         %Change vs. Previous Significant Change (*) AP Spine L1-L3 05/23/2023 82.9 Normal -0.4 1.132 g/cm2 DualFemur Neck Right 05/23/2023 82.9 Osteopenia -1.8 0.790 g/cm2 ASSESSMENT: The BMD measured at Femur Neck Right is 0.790 g/cm2 with a T-score of -1.8. This patient is considered osteopenic according to World Health Organization [facility]) criteria. The scan quality is good. L-4 was excluded due to degenerative changes. World Health Organization [facility]) criteria for post-menopausal, Caucasian Women: Normal:  T-score at or above -1 SD Osteopenia/low bone mass: T-score between -1 and -2.5 SD Osteoporosis:             T-score at or below -2.5 SD RECOMMENDATIONS: 1. All patients should optimize calcium and vitamin D intake. 2.  Consider FDA-approved medical therapies in postmenopausal women and men aged 67 years and older, based on the following: a. A hip or vertebral(clinical or morphometric) fracture b. T-score < -2.5 at the femoral neck or spine after appropriate evaluation to exclude secondary causes c. Low bone mass (T-score between -1.0 and -2.5 at the femoral neck or spine) and a 10-year probability of a hip fracture > 3% or a 10-year probability of a major osteoporosis-related fracture > 20% based on the [place] -adapted WHO algorithm 3. Clinician judgment and/or patient preferences may indicate treatment for people with 10-year fracture probabilities above or below these levels FOLLOW-UP: People with diagnosed cases of osteoporosis or at high risk for fracture should have regular bone mineral density tests. For patients eligible for Medicare, routine testing is allowed once every 2 years. The testing frequency can be increased to one year for patients who have rapidly progressing disease, those who are receiving or discontinuing medical therapy to restore bone mass, or have additional risk factors. I have reviewed this report, and agree with the above findings. [facility] Electronically Signed   By: Dina  Arceo M.D.   On: 05/23/2023 09:25       Assessment & Plan:  Hypothyroidism, unspecified type Assessment & Plan: On thyroid  replacement. Follow tsh.    Hypercholesteremia Assessment & Plan: Low cholesterol diet and exercise. Follow lipid panel.  Lab Results  Component Value Date   CHOL 214 (H) 01/14/2024   HDL 84.40 01/14/2024   LDLCALC 112 (H) 01/14/2024   TRIG 85.0 01/14/2024   CHOLHDL 3 01/14/2024      History of TIA (transient ischemic attack) Assessment & Plan: Previously described the episode she had in 2016. W/up unrevealing per her report. Appears to be c/w TIA. Intermittent sensation changes as outlined. Has a history of vertigo and has been worked up and treated by ENT. Discussed  reevaluation. Discussed MRI. Outside blood pressures reviewed - most averaging 120-130/60s-70s.  Exercises regularly. Discussed 81mg  aspiring daily. Will notify me if desires further testing/evaluation.    History of breast cancer Assessment & Plan: Mammogram 04/04/23 - birads II.    Dizziness Assessment & Plan: Has a history of vertigo. Epley maneuvers have worked previously. Currently with no room spinning dizziness. Occasional sensation change as outlined. Discussed reevaluation - ENT, neurology - follow. Notify me if desires further testing.       Allena Hamilton, MD

## 2024-02-22 ENCOUNTER — Encounter: Payer: Self-pay | Admitting: Internal Medicine

## 2024-02-22 NOTE — Assessment & Plan Note (Signed)
 Previously described the episode she had in 2016. W/up unrevealing per her report. Appears to be c/w TIA. Intermittent sensation changes as outlined. Has a history of vertigo and has been worked up and treated by ENT. Discussed reevaluation. Discussed MRI. Outside blood pressures reviewed - most averaging 120-130/60s-70s.  Exercises regularly. Discussed 81mg  aspiring daily. Will notify me if desires further testing/evaluation.

## 2024-02-22 NOTE — Assessment & Plan Note (Signed)
 Mammogram 04/04/23 - birads II.

## 2024-02-22 NOTE — Assessment & Plan Note (Signed)
 On thyroid replacement.  Follow tsh.

## 2024-02-22 NOTE — Assessment & Plan Note (Signed)
 Has a history of vertigo. Epley maneuvers have worked previously. Currently with no room spinning dizziness. Occasional sensation change as outlined. Discussed reevaluation - ENT, neurology - follow. Notify me if desires further testing.

## 2024-02-22 NOTE — Assessment & Plan Note (Signed)
 Low cholesterol diet and exercise. Follow lipid panel.  Lab Results  Component Value Date   CHOL 214 (H) 01/14/2024   HDL 84.40 01/14/2024   LDLCALC 112 (H) 01/14/2024   TRIG 85.0 01/14/2024   CHOLHDL 3 01/14/2024

## 2024-03-03 ENCOUNTER — Telehealth: Payer: Self-pay

## 2024-03-03 NOTE — Telephone Encounter (Signed)
 Noted

## 2024-03-03 NOTE — Telephone Encounter (Signed)
 Copied from CRM [id number]. Topic: Clinical - Medical Advice >> Mar 03, 2024  3:22 PM Brittany M wrote: Reason for CRM: Patient would like an order faxed for a Breast prostheses placed as well a bra to.  Previous order has expired. Please contact patient.  [employer] [address], [place], [place] [address] Phone# [phone] Fax# [phone] >> Mar 03, 2024  3:32 PM Revonda D wrote: UPDATE: Please disregard. Pt stated that she contacted them back and they stated they found the order.

## 2024-03-21 ENCOUNTER — Ambulatory Visit
Admission: EM | Admit: 2024-03-21 | Discharge: 2024-03-21 | Disposition: A | Discharge status: Home / Self Care | Attending: Emergency Medicine | Admitting: Emergency Medicine

## 2024-03-21 ENCOUNTER — Encounter: Payer: Self-pay | Admitting: Emergency Medicine

## 2024-03-21 DIAGNOSIS — J0110 Acute frontal sinusitis, unspecified: Secondary | ICD-10-CM

## 2024-03-21 MED ORDER — IPRATROPIUM BROMIDE 0.03 % NA SOLN: 2.0000 | Freq: Two times a day (BID) | NASAL | 0 refills | Status: AC

## 2024-03-21 MED ORDER — AMOXICILLIN-POT CLAVULANATE 875-125 MG PO TABS: 1.0000 | ORAL_TABLET | Freq: Two times a day (BID) | ORAL | 0 refills | Status: DC

## 2024-03-21 NOTE — Discharge Instructions (Signed)
 You are being treated for sinus infection  Take Augmentin  twice daily for 7 days for treatment of bacteria causing symptoms to linger  You may use nasal spray twice daily to further reduce congestion within the sinuses helping to resolve some of your discomfort  You can take Tylenol and/or Ibuprofen as needed for fever reduction and pain relief.   For cough: honey 1/2 to 1 teaspoon (you can dilute the honey in water or another fluid).  You can also use guaifenesin and dextromethorphan for cough. You can use a humidifier for chest congestion and cough.  If you don't have a humidifier, you can sit in the bathroom with the hot shower running.      For sore throat: try warm salt water gargles, cepacol lozenges, throat spray, warm tea or water with lemon/honey, popsicles or ice, or OTC cold relief medicine for throat discomfort.   For congestion: take a daily anti-histamine like Zyrtec, Claritin, and a oral decongestant, such as pseudoephedrine.  You can also use Flonase 1-2 sprays in each nostril daily.   It is important to stay hydrated: drink plenty of fluids (water, gatorade/powerade/pedialyte, juices, or teas) to keep your throat moisturized and help further relieve irritation/discomfort.

## 2024-03-21 NOTE — ED Provider Notes (Signed)
 [name]    CSN: [id number] Arrival date & time: 03/21/24  1125      History   Chief Complaint Chief Complaint  Patient presents with   Hoarse   Cough   Nasal Congestion    HPI Kayla Navarro is a 83 y.o. female.   Patient presents for evaluation of nasal congestion, sinus pain and pressure behind the eyes and a general fullness to the head, sore throat and cough present for 6 days.  Endorses a tickle to the throat but denies actual pain, has resolved.  Has experienced intermittent hoarseness.  Tolerable to food and liquids.  No known sick contacts.  Endorses sinusitis at least yearly.  Has attempted use of Robitussin and saline nasal spray.    Past Medical History:  Diagnosis Date   Degenerative disc disease, cervical    GERD (gastroesophageal reflux disease)    History of colon polyps    History of hypertension    Hypercholesteremia    Hypothyroid     Patient Active Problem List   Diagnosis Date Noted   History of TIA (transient ischemic attack) 01/25/2024   Sleep difficulties 01/19/2023   Dizziness 07/21/2022   Headache 07/15/2022   Healthcare maintenance 06/21/2021   Acute non-recurrent frontal sinusitis 06/05/2021   Breast asymmetry following reconstructive surgery 01/26/2021   Counseling for travel 12/02/2020   Elevated blood pressure reading 12/02/2020   Sinusitis 08/19/2020   Hot flashes 02/20/2020   Hypothyroidism 11/06/2019   Hypercholesteremia 11/06/2019   Degenerative disc disease, cervical 11/06/2019   History of breast cancer 11/06/2019   History of colon polyps 11/06/2019   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      Home Medications    Prior to Admission medications   Medication Sig Start Date End Date Taking? Authorizing Provider  Biotin 10 MG TABS Take by mouth.    [provider]  Calcium Carbonate Antacid (CALCIUM CARBONATE, DOSED IN MG ELEMENTAL CALCIUM,) 1250 MG/5ML SUSP Take by mouth.    [provider]  Cholecalciferol (VITAMIN D3) 10 MCG (400 UNIT) CAPS Take by mouth.    [provider]  levothyroxine  (SYNTHROID ) 75 MCG tablet TAKE 1 TABLET DAILY 11/05/23   Glendia Shad, MD  mupirocin  ointment (BACTROBAN ) 2 % Apply 1 Application topically 2 (two) times daily. 07/15/22   Glendia Shad, MD  Red Yeast Rice Extract 600 MG CAPS Take 600 mg by mouth daily. 02/14/12   [provider]  Zinc Sulfate (ZINC 15 PO) Take by mouth. Patient not taking: Reported on 02/17/2024    [provider]    Family History Family History  Problem Relation Age of Onset   Stomach cancer Mother    Hypertension Mother    CAD Mother    Hypercholesterolemia Mother    Parkinson's disease Father    Heart disease Father    High Cholesterol Father    Heart disease Brother    Cancer - Other Brother    Breast cancer Maternal Aunt    Parkinson's  disease Paternal Grandmother     Social History Social History   Tobacco Use   Smoking status: Never   Smokeless tobacco: Never  Substance Use Topics   Alcohol use: Yes    Comment: occasional   Drug use: Never     Allergies   Clindamycin, Sulfa antibiotics, Codeine, and Tramadol   Review of Systems Review of Systems  Constitutional: Negative.   HENT:  Positive for congestion, sinus pressure, sinus pain and sore throat. Negative for dental problem, drooling, ear discharge, ear pain, facial swelling, hearing loss, mouth sores, nosebleeds, postnasal drip, rhinorrhea, sneezing, tinnitus, trouble swallowing and voice change.   Respiratory:   Positive for cough. Negative for apnea, choking, chest tightness, shortness of breath, wheezing and stridor.   Gastrointestinal: Negative.   Neurological: Negative.      Physical Exam Triage Vital Signs ED Triage Vitals  Encounter Vitals Group     BP 03/21/24 1315 (!) 147/72     Girls Systolic BP Percentile --      Girls Diastolic BP Percentile --      Boys Systolic BP Percentile --      Boys Diastolic BP Percentile --      Pulse Rate 03/21/24 1315 90     Resp 03/21/24 1315 20     Temp 03/21/24 1315 97.9 F (36.6 C)     Temp Source 03/21/24 1315 Oral     SpO2 03/21/24 1315 98 %     Weight --      Height --      Head Circumference --      Peak Flow --      Pain Score 03/21/24 1319 0     Pain Loc --      Pain Education --      Exclude from Growth Chart --    No data found.  Updated Vital Signs BP (!) 147/72 (BP Location: Right Arm)   Pulse 90   Temp 97.9 F (36.6 C) (Oral)   Resp 20   SpO2 98%   Visual Acuity Right Eye Distance:   Left Eye Distance:   Bilateral Distance:    Right Eye Near:   Left Eye Near:    Bilateral Near:     Physical Exam Constitutional:      Appearance: Normal appearance.  HENT:     Right Ear: Tympanic membrane, ear canal and external ear normal.     Left Ear: Tympanic membrane, ear canal and external ear normal.     Nose: Congestion present.     Mouth/Throat:     Pharynx: No oropharyngeal exudate or posterior oropharyngeal erythema.  Eyes:     Extraocular Movements: Extraocular movements intact.  Cardiovascular:     Rate and Rhythm: Normal rate and regular rhythm.     Pulses: Normal pulses.     Heart sounds: Normal heart sounds.  Pulmonary:     Effort: Pulmonary effort is normal.     Breath sounds: Normal breath sounds.  Neurological:     Mental Status: She is alert and oriented to person, place, and time. Mental status is at baseline.      UC Treatments / Results  Labs (all labs ordered are listed, but only abnormal  results are displayed) Labs Reviewed - No data to display  EKG   Radiology No results found.  Procedures Procedures (including critical care time)  Medications Ordered in UC Medications - No data to display  Initial Impression / Assessment and Plan / UC Course  I have reviewed the triage vital signs and the nursing notes.  Pertinent labs & imaging results that were available during my care of the patient were reviewed by me and considered in my medical decision making (see chart for details).   Acute nonrecurrent frontal sinusitis  Patient is in no signs of distress nor toxic appearing.  Vital signs are stable.  Low suspicion for pneumonia, pneumothorax or bronchitis and therefore will defer imaging.  Presentation concerning for sinusitis empirically placed on Augmentin  prescribe Atrovent  nasal spray, declined use of steroids.May use additional over-the-counter medications as needed for supportive care.  May follow-up with urgent care as needed if symptoms persist or worsen.  Final Clinical Impressions(s) / UC Diagnoses   Final diagnoses:  None   Discharge Instructions   None    ED Prescriptions   None    PDMP not reviewed this encounter.   Teresa Shelba SAUNDERS, NP 03/21/24 1352

## 2024-03-21 NOTE — ED Triage Notes (Signed)
 Patient reports cough and sinus congestion x 6 days. Patient has been using aline nasal spray and Robitussin cough syrup with no relief. Patient also complains of hoarseness.

## 2024-04-26 ENCOUNTER — Ambulatory Visit
Admission: RE | Admit: 2024-04-26 | Discharge: 2024-04-26 | Disposition: A | Discharge status: Home / Self Care | Source: Ambulatory Visit | Attending: Internal Medicine | Admitting: Internal Medicine

## 2024-04-26 DIAGNOSIS — Z1231 Encounter for screening mammogram for malignant neoplasm of breast: Secondary | ICD-10-CM | POA: Insufficient documentation

## 2024-05-30 ENCOUNTER — Telehealth: Payer: Self-pay

## 2024-05-30 NOTE — Telephone Encounter (Signed)
 I left message with patient's husband asking her to please call us .  I also sent a message to patient via MyChart.  E2C2 - when patient returns our call, please assist her with rescheduling her 06/01/2024 lab appointment, as we will have a two-hour delay that morning due to the inclement weather.

## 2024-06-01 ENCOUNTER — Other Ambulatory Visit

## 2024-06-02 ENCOUNTER — Other Ambulatory Visit

## 2024-06-03 ENCOUNTER — Ambulatory Visit: Admitting: Internal Medicine

## 2024-06-03 ENCOUNTER — Encounter: Payer: Self-pay | Admitting: Internal Medicine

## 2024-06-03 VITALS — BP 142/76 | HR 66 | Temp 98.1°F | Ht 61.0 in | Wt 134.0 lb

## 2024-06-03 DIAGNOSIS — Z853 Personal history of malignant neoplasm of breast: Secondary | ICD-10-CM

## 2024-06-03 DIAGNOSIS — G479 Sleep disorder, unspecified: Secondary | ICD-10-CM

## 2024-06-03 DIAGNOSIS — E7800 Pure hypercholesterolemia, unspecified: Secondary | ICD-10-CM

## 2024-06-03 MED ORDER — AMLODIPINE BESYLATE 2.5 MG PO TABS: 2.5000 mg | ORAL_TABLET | Freq: Every day | ORAL | 2 refills | Status: AC

## 2024-06-03 NOTE — Assessment & Plan Note (Signed)
 Mammogram 04/28/24- birads I.

## 2024-06-03 NOTE — Progress Notes (Unsigned)
 "  Subjective:    Patient ID: Kayla Navarro, female    DOB: [birth year], 84 y.o.   MRN: [id number]  Patient here for  Chief Complaint  Patient presents with   Medical Management of Chronic Issues   Annual Exam    HPI Here for a scheduled follow up - follow up regarding hypercholesterolemia and hypothyroidism. History of vertigo. Has been worked up and treated by ENT.   Release labs.    Past Medical History:  Diagnosis Date   Degenerative disc disease, cervical    GERD (gastroesophageal reflux disease)    History of colon polyps    History of hypertension    Hypercholesteremia    Hypothyroid    Past Surgical History:  Procedure Laterality Date   AUGMENTATION MAMMAPLASTY Left 2009   transflap done on mastectomy    BREAST EXCISIONAL BIOPSY Right    1990's neg   CATARACT EXTRACTION Right 2015   MASTECTOMY  2002   ROTATOR CUFF REPAIR     TONSILLECTOMY AND ADENOIDECTOMY     TRIGGER FINGER RELEASE     Family History  Problem Relation Age of Onset   Stomach cancer Mother    Hypertension Mother    CAD Mother    Hypercholesterolemia Mother    Parkinson's disease Father    Heart disease Father    High Cholesterol Father    Heart disease Brother    Cancer - Other Brother    Breast cancer Maternal Aunt    Parkinson's disease Paternal Grandmother    Social History   Socioeconomic History   Marital status: Married    Spouse name: Not on file   Number of children: Not on file   Years of education: Not on file   Highest education level: Not on file  Occupational History   Not on file  Tobacco Use   Smoking status: Never   Smokeless tobacco: Never  Substance and Sexual Activity   Alcohol use: Yes    Comment: occasional   Drug use: Never   Sexual activity: Not Currently  Other Topics Concern   Not on file  Social History Narrative   Married   Social Drivers of Health   Tobacco Use: Low Risk (06/03/2024)   Patient History    Smoking Tobacco Use: Never     Smokeless Tobacco Use: Never    Passive Exposure: Not on file  Financial Resource Strain: Low Risk (01/24/2023)   Overall Financial Resource Strain (CARDIA)    Difficulty of Paying Living Expenses: Not hard at all  Food Insecurity: No Food Insecurity (01/24/2023)   Hunger Vital Sign    Worried About Running Out of Food in the Last Year: Never true    Ran Out of Food in the Last Year: Never true  Transportation Needs: No Transportation Needs (01/24/2023)   PRAPARE - Administrator, Civil Service (Medical): No    Lack of Transportation (Non-Medical): No  Physical Activity: Sufficiently Active (01/24/2023)   Exercise Vital Sign    Days of Exercise per Week: 4 days    Minutes of Exercise per Session: 40 min  Stress: No Stress Concern Present (01/24/2023)   [employer] of Occupational Health - Occupational Stress Questionnaire    Feeling of Stress : Only a little  Social Connections: Socially Integrated (01/24/2023)   Social Connection and Isolation Panel    Frequency of Communication with Friends and Family: More than three times a week    Frequency of Social Gatherings with Friends  and Family: More than three times a week    Attends Religious Services: More than 4 times per year    Active Member of Clubs or Organizations: Yes    Attends Banker Meetings: More than 4 times per year    Marital Status: Married  Depression (PHQ2-9): Low Risk (02/17/2024)   Depression (PHQ2-9)    PHQ-2 Score: 1  Alcohol Screen: Low Risk (01/24/2023)   Alcohol Screen    Last Alcohol Screening Score (AUDIT): 2  Housing: Low Risk (01/24/2023)   Housing    Last Housing Risk Score: 0  Utilities: Not At Risk (01/24/2023)   AHC Utilities    Threatened with loss of utilities: No  Health Literacy: Adequate Health Literacy (01/24/2023)   B1300 Health Literacy    Frequency of need for help with medical instructions: Never     Review of Systems     Objective:     BP (!) 142/76    Pulse 66   Temp 98.1 F (36.7 C) (Oral)   Ht 5' 1 (1.549 m)   Wt 134 lb (60.8 kg)   SpO2 97%   BMI 25.32 kg/m  Wt Readings from Last 3 Encounters:  06/03/24 134 lb (60.8 kg)  02/17/24 126 lb 12.8 oz (57.5 kg)  01/19/24 127 lb 6.4 oz (57.8 kg)    Physical Exam  {Perform Simple Foot Exam  Perform Detailed exam:1} {Insert foot Exam (Optional):30965}   Outpatient Encounter Medications as of 06/03/2024  Medication Sig   amoxicillin -clavulanate (AUGMENTIN ) 875-125 MG tablet Take 1 tablet by mouth every 12 (twelve) hours.   Biotin 10 MG TABS Take by mouth.   Calcium Carbonate Antacid (CALCIUM CARBONATE, DOSED IN MG ELEMENTAL CALCIUM,) 1250 MG/5ML SUSP Take by mouth.   Cholecalciferol (VITAMIN D3) 10 MCG (400 UNIT) CAPS Take by mouth.   ipratropium (ATROVENT ) 0.03 % nasal spray Place 2 sprays into both nostrils every 12 (twelve) hours.   levothyroxine  (SYNTHROID ) 75 MCG tablet TAKE 1 TABLET DAILY   mupirocin  ointment (BACTROBAN ) 2 % Apply 1 Application topically 2 (two) times daily.   Red Yeast Rice Extract 600 MG CAPS Take 600 mg by mouth daily.   Zinc Sulfate (ZINC 15 PO) Take by mouth. (Patient not taking: Reported on 02/17/2024)   No facility-administered encounter medications on file as of 06/03/2024.     Lab Results  Component Value Date   WBC 5.3 07/16/2023   HGB 13.9 07/16/2023   HCT 40.4 07/16/2023   PLT 170.0 07/16/2023   GLUCOSE 85 01/14/2024   CHOL 214 (H) 01/14/2024   TRIG 85.0 01/14/2024   HDL 84.40 01/14/2024   LDLCALC 112 (H) 01/14/2024   ALT 14 01/14/2024   AST 24 01/14/2024   NA 136 01/14/2024   K 5.0 01/14/2024   CL 99 01/14/2024   CREATININE 0.74 01/14/2024   BUN 20 01/14/2024   CO2 29 01/14/2024   TSH 0.98 07/16/2023    MM 3D SCREENING MAMMOGRAM UNILATERAL RIGHT BREAST Result Date: 04/28/2024 CLINICAL DATA:  Screening. EXAM: DIGITAL SCREENING UNILATERAL RIGHT MAMMOGRAM WITH CAD AND TOMOSYNTHESIS TECHNIQUE: Right screening digital craniocaudal and  mediolateral oblique mammograms were obtained. Right screening digital breast tomosynthesis was performed. The images were evaluated with computer-aided detection. COMPARISON:  Previous exam(s). ACR Breast Density Category c: The breasts are heterogeneously dense, which may obscure small masses. FINDINGS: The patient has had a left mastectomy. There are no findings suspicious for malignancy. IMPRESSION: No mammographic evidence of malignancy. A result letter of this screening  mammogram will be mailed directly to the patient. RECOMMENDATION: Screening mammogram in one year.  (Code:SM-R-58M) BI-RADS CATEGORY  1: Negative. Electronically Signed   By: Curtistine Noble   On: 04/28/2024 16:26       Assessment & Plan:  Hypercholesteremia  History of breast cancer Assessment & Plan: Mammogram 04/28/24- birads I.       Allena Hamilton, MD "

## 2024-06-11 ENCOUNTER — Other Ambulatory Visit

## 2024-06-14 ENCOUNTER — Ambulatory Visit: Admitting: Sleep Medicine

## 2024-07-23 ENCOUNTER — Ambulatory Visit

## 2024-07-26 ENCOUNTER — Ambulatory Visit: Admitting: Internal Medicine
